# Patient Record
Sex: Female | Born: 1950 | Race: White | Hispanic: No | Marital: Married | Smoking: Never smoker
Health system: Southern US, Community
[De-identification: ages and names within clinical notes are randomized; demographics above are authoritative.]

## PROBLEM LIST (undated history)

## (undated) DIAGNOSIS — L570 Actinic keratosis: Secondary | ICD-10-CM

## (undated) DIAGNOSIS — I10 Essential (primary) hypertension: Secondary | ICD-10-CM

## (undated) DIAGNOSIS — G5603 Carpal tunnel syndrome, bilateral upper limbs: Secondary | ICD-10-CM

## (undated) DIAGNOSIS — Z973 Presence of spectacles and contact lenses: Secondary | ICD-10-CM

## (undated) HISTORY — DX: Actinic keratosis: L57.0

## (undated) HISTORY — PX: COLONOSCOPY: SHX174

## (undated) HISTORY — PX: VARICOSE VEIN SURGERY: SHX832

---

## 2005-12-10 ENCOUNTER — Ambulatory Visit: Payer: Self-pay | Admitting: Internal Medicine

## 2007-02-27 ENCOUNTER — Ambulatory Visit: Payer: Self-pay | Admitting: Nurse Practitioner

## 2008-04-29 ENCOUNTER — Ambulatory Visit: Payer: Self-pay | Admitting: Internal Medicine

## 2009-05-26 ENCOUNTER — Ambulatory Visit: Payer: Self-pay | Admitting: Family Medicine

## 2011-06-18 ENCOUNTER — Ambulatory Visit: Payer: Self-pay | Admitting: Internal Medicine

## 2015-12-06 ENCOUNTER — Other Ambulatory Visit: Payer: Self-pay | Admitting: Internal Medicine

## 2015-12-06 DIAGNOSIS — E785 Hyperlipidemia, unspecified: Secondary | ICD-10-CM | POA: Insufficient documentation

## 2015-12-06 DIAGNOSIS — J302 Other seasonal allergic rhinitis: Secondary | ICD-10-CM | POA: Insufficient documentation

## 2015-12-06 DIAGNOSIS — H903 Sensorineural hearing loss, bilateral: Secondary | ICD-10-CM | POA: Insufficient documentation

## 2015-12-06 DIAGNOSIS — Z1231 Encounter for screening mammogram for malignant neoplasm of breast: Secondary | ICD-10-CM

## 2015-12-08 ENCOUNTER — Ambulatory Visit
Admission: RE | Admit: 2015-12-08 | Discharge: 2015-12-08 | Disposition: A | Discharge status: Home / Self Care | Payer: Medicare HMO | Source: Ambulatory Visit | Attending: Internal Medicine | Admitting: Internal Medicine

## 2015-12-08 DIAGNOSIS — Z1231 Encounter for screening mammogram for malignant neoplasm of breast: Secondary | ICD-10-CM | POA: Diagnosis present

## 2015-12-21 DIAGNOSIS — G5603 Carpal tunnel syndrome, bilateral upper limbs: Secondary | ICD-10-CM | POA: Insufficient documentation

## 2017-03-21 ENCOUNTER — Other Ambulatory Visit: Payer: Self-pay | Admitting: Internal Medicine

## 2017-03-21 DIAGNOSIS — Z1231 Encounter for screening mammogram for malignant neoplasm of breast: Secondary | ICD-10-CM

## 2017-04-04 ENCOUNTER — Ambulatory Visit
Admission: RE | Admit: 2017-04-04 | Discharge: 2017-04-04 | Disposition: A | Discharge status: Home / Self Care | Payer: Medicare Other | Source: Ambulatory Visit | Attending: Internal Medicine | Admitting: Internal Medicine

## 2017-04-04 ENCOUNTER — Encounter: Payer: Self-pay | Admitting: Radiology

## 2017-04-04 DIAGNOSIS — Z1231 Encounter for screening mammogram for malignant neoplasm of breast: Secondary | ICD-10-CM

## 2017-06-19 DIAGNOSIS — I10 Essential (primary) hypertension: Secondary | ICD-10-CM | POA: Insufficient documentation

## 2017-10-29 ENCOUNTER — Encounter: Admission: RE | Payer: Self-pay | Source: Ambulatory Visit

## 2017-10-29 ENCOUNTER — Ambulatory Visit: Admission: RE | Admit: 2017-10-29 | Payer: Medicare Other | Source: Ambulatory Visit | Admitting: Gastroenterology

## 2017-10-29 SURGERY — COLONOSCOPY WITH PROPOFOL
Anesthesia: General

## 2018-04-08 ENCOUNTER — Other Ambulatory Visit: Payer: Self-pay | Admitting: Internal Medicine

## 2018-04-08 DIAGNOSIS — Z1231 Encounter for screening mammogram for malignant neoplasm of breast: Secondary | ICD-10-CM

## 2018-04-09 ENCOUNTER — Encounter (INDEPENDENT_AMBULATORY_CARE_PROVIDER_SITE_OTHER): Payer: Self-pay

## 2018-04-09 ENCOUNTER — Ambulatory Visit
Admission: RE | Admit: 2018-04-09 | Discharge: 2018-04-09 | Disposition: A | Discharge status: Home / Self Care | Payer: Medicare Other | Source: Ambulatory Visit | Attending: Internal Medicine | Admitting: Internal Medicine

## 2018-04-09 DIAGNOSIS — Z1231 Encounter for screening mammogram for malignant neoplasm of breast: Secondary | ICD-10-CM | POA: Diagnosis present

## 2018-05-05 ENCOUNTER — Telehealth: Payer: Self-pay | Admitting: Gastroenterology

## 2018-05-05 NOTE — Telephone Encounter (Signed)
Patient called to schedule colonoscopy. Please call

## 2018-05-07 NOTE — Telephone Encounter (Signed)
LVM for pt to call office to schedule.

## 2018-05-07 NOTE — Telephone Encounter (Signed)
LVM for

## 2018-05-15 ENCOUNTER — Other Ambulatory Visit: Payer: Self-pay

## 2018-05-15 ENCOUNTER — Telehealth: Payer: Self-pay

## 2018-05-15 DIAGNOSIS — Z1211 Encounter for screening for malignant neoplasm of colon: Secondary | ICD-10-CM

## 2018-05-15 NOTE — Telephone Encounter (Signed)
LVM for pt to callback to schedule her colonoscopy.

## 2018-05-27 ENCOUNTER — Other Ambulatory Visit: Payer: Self-pay

## 2018-05-27 ENCOUNTER — Encounter: Payer: Self-pay | Admitting: *Deleted

## 2018-05-29 NOTE — Discharge Instructions (Signed)
General Anesthesia, Adult, Care After °These instructions provide you with information about caring for yourself after your procedure. Your health care provider may also give you more specific instructions. Your treatment has been planned according to current medical practices, but problems sometimes occur. Call your health care provider if you have any problems or questions after your procedure. °What can I expect after the procedure? °After the procedure, it is common to have: °· Vomiting. °· A sore throat. °· Mental slowness. ° °It is common to feel: °· Nauseous. °· Cold or shivery. °· Sleepy. °· Tired. °· Sore or achy, even in parts of your body where you did not have surgery. ° °Follow these instructions at home: °For at least 24 hours after the procedure: °· Do not: °? Participate in activities where you could fall or become injured. °? Drive. °? Use heavy machinery. °? Drink alcohol. °? Take sleeping pills or medicines that cause drowsiness. °? Make important decisions or sign legal documents. °? Take care of children on your own. °· Rest. °Eating and drinking °· If you vomit, drink water, juice, or soup when you can drink without vomiting. °· Drink enough fluid to keep your urine clear or pale yellow. °· Make sure you have little or no nausea before eating solid foods. °· Follow the diet recommended by your health care provider. °General instructions °· Have a responsible adult stay with you until you are awake and alert. °· Return to your normal activities as told by your health care provider. Ask your health care provider what activities are safe for you. °· Take over-the-counter and prescription medicines only as told by your health care provider. °· If you smoke, do not smoke without supervision. °· Keep all follow-up visits as told by your health care provider. This is important. °Contact a health care provider if: °· You continue to have nausea or vomiting at home, and medicines are not helpful. °· You  cannot drink fluids or start eating again. °· You cannot urinate after 8-12 hours. °· You develop a skin rash. °· You have fever. °· You have increasing redness at the site of your procedure. °Get help right away if: °· You have difficulty breathing. °· You have chest pain. °· You have unexpected bleeding. °· You feel that you are having a life-threatening or urgent problem. °This information is not intended to replace advice given to you by your health care provider. Make sure you discuss any questions you have with your health care provider. °Document Released: 02/11/2001 Document Revised: 04/09/2016 Document Reviewed: 10/20/2015 °Elsevier Interactive Patient Education © 2018 [employer] ° °

## 2018-06-02 ENCOUNTER — Ambulatory Visit: Payer: Medicare Other | Admitting: Anesthesiology

## 2018-06-02 ENCOUNTER — Encounter
Admission: RE | Disposition: A | Discharge status: Home / Self Care | Payer: Self-pay | Source: Ambulatory Visit | Attending: Gastroenterology

## 2018-06-02 ENCOUNTER — Ambulatory Visit
Admission: RE | Admit: 2018-06-02 | Discharge: 2018-06-02 | Disposition: A | Discharge status: Home / Self Care | Payer: Medicare Other | Source: Ambulatory Visit | Attending: Gastroenterology | Admitting: Gastroenterology

## 2018-06-02 DIAGNOSIS — K640 First degree hemorrhoids: Secondary | ICD-10-CM | POA: Insufficient documentation

## 2018-06-02 DIAGNOSIS — Z1211 Encounter for screening for malignant neoplasm of colon: Secondary | ICD-10-CM

## 2018-06-02 DIAGNOSIS — Z79899 Other long term (current) drug therapy: Secondary | ICD-10-CM | POA: Insufficient documentation

## 2018-06-02 DIAGNOSIS — Z885 Allergy status to narcotic agent status: Secondary | ICD-10-CM | POA: Insufficient documentation

## 2018-06-02 DIAGNOSIS — I10 Essential (primary) hypertension: Secondary | ICD-10-CM | POA: Insufficient documentation

## 2018-06-02 DIAGNOSIS — Z8371 Family history of colonic polyps: Secondary | ICD-10-CM | POA: Insufficient documentation

## 2018-06-02 HISTORY — DX: Presence of spectacles and contact lenses: Z97.3

## 2018-06-02 HISTORY — DX: Essential (primary) hypertension: I10

## 2018-06-02 HISTORY — DX: Carpal tunnel syndrome, bilateral upper limbs: G56.03

## 2018-06-02 HISTORY — PX: COLONOSCOPY WITH PROPOFOL: SHX5780

## 2018-06-02 SURGERY — COLONOSCOPY WITH PROPOFOL
Anesthesia: General | Wound class: Contaminated

## 2018-06-02 MED ORDER — LACTATED RINGERS IV SOLN
INTRAVENOUS | Status: DC
  Administered 2018-06-02: 08:00:00 via INTRAVENOUS

## 2018-06-02 MED ORDER — ACETAMINOPHEN 160 MG/5ML PO SOLN: 325.0000 mg | Freq: Once | ORAL | Status: DC

## 2018-06-02 MED ORDER — PROPOFOL 10 MG/ML IV BOLUS
INTRAVENOUS | Status: DC | PRN
  Administered 2018-06-02: 20 mg via INTRAVENOUS
  Administered 2018-06-02: 30 mg via INTRAVENOUS
  Administered 2018-06-02: 70 mg via INTRAVENOUS
  Administered 2018-06-02: 20 mg via INTRAVENOUS
  Administered 2018-06-02: 30 mg via INTRAVENOUS
  Administered 2018-06-02 (×4): 20 mg via INTRAVENOUS
  Administered 2018-06-02: 30 mg via INTRAVENOUS

## 2018-06-02 MED ORDER — SODIUM CHLORIDE 0.9 % IV SOLN: INTRAVENOUS | Status: DC

## 2018-06-02 MED ORDER — ACETAMINOPHEN 325 MG PO TABS: 325.0000 mg | ORAL_TABLET | Freq: Once | ORAL | Status: DC

## 2018-06-02 MED ORDER — STERILE WATER FOR IRRIGATION IR SOLN
Status: DC | PRN
  Administered 2018-06-02: 5 mL

## 2018-06-02 MED ORDER — LIDOCAINE HCL (CARDIAC) PF 100 MG/5ML IV SOSY
PREFILLED_SYRINGE | INTRAVENOUS | Status: DC | PRN
  Administered 2018-06-02: 40 mg via INTRAVENOUS

## 2018-06-02 SURGICAL SUPPLY — 24 items
CANISTER SUCT 1200ML W/VALVE (MISCELLANEOUS) ×2 IMPLANT
CLIP HMST 235XBRD CATH ROT (MISCELLANEOUS) IMPLANT
CLIP RESOLUTION 360 11X235 (MISCELLANEOUS)
ELECT REM PT RETURN 9FT ADLT (ELECTROSURGICAL)
ELECTRODE REM PT RTRN 9FT ADLT (ELECTROSURGICAL) IMPLANT
FCP ESCP3.2XJMB 240X2.8X (MISCELLANEOUS)
FORCEPS BIOP RAD 4 LRG CAP 4 (CUTTING FORCEPS) IMPLANT
FORCEPS BIOP RJ4 240 W/NDL (MISCELLANEOUS)
FORCEPS ESCP3.2XJMB 240X2.8X (MISCELLANEOUS) IMPLANT
GOWN CVR UNV OPN BCK APRN NK (MISCELLANEOUS) ×2 IMPLANT
GOWN ISOL THUMB LOOP REG UNIV (MISCELLANEOUS) ×2
INJECTOR VARIJECT VIN23 (MISCELLANEOUS) IMPLANT
KIT DEFENDO VALVE AND CONN (KITS) IMPLANT
KIT ENDO PROCEDURE OLY (KITS) ×2 IMPLANT
MARKER SPOT ENDO TATTOO 5ML (MISCELLANEOUS) IMPLANT
PROBE APC STR FIRE (PROBE) IMPLANT
RETRIEVER NET ROTH 2.5X230 LF (MISCELLANEOUS) IMPLANT
SNARE SHORT THROW 13M SML OVAL (MISCELLANEOUS) IMPLANT
SNARE SHORT THROW 30M LRG OVAL (MISCELLANEOUS) IMPLANT
SNARE SNG USE RND 15MM (INSTRUMENTS) IMPLANT
SPOT EX ENDOSCOPIC TATTOO (MISCELLANEOUS)
TRAP ETRAP POLY (MISCELLANEOUS) IMPLANT
VARIJECT INJECTOR VIN23 (MISCELLANEOUS)
WATER STERILE IRR 250ML POUR (IV SOLUTION) ×2 IMPLANT

## 2018-06-02 NOTE — Anesthesia Procedure Notes (Signed)
Performed by: Orvile Corona, CRNA Pre-anesthesia Checklist: Patient identified, Emergency Drugs available, Suction available, Timeout performed and Patient being monitored Patient Re-evaluated:Patient Re-evaluated prior to induction Oxygen Delivery Method: Nasal cannula Placement Confirmation: positive ETCO2       

## 2018-06-02 NOTE — H&P (Signed)
Becky Lame, MD [facility] [address] [place], [place] [address] Phone:[phone] Fax : [phone]  Primary Care Physician:  Ezequiel Kayser, MD Primary Gastroenterologist:  Dr. Allen Norris  Pre-Procedure History & Physical: HPI:  Becky Carr is a 67 y.o. female is here for an colonoscopy.   Past Medical History:  Diagnosis Date  . Carpal tunnel syndrome on both sides   . Hypertension   . Wears contact lenses     Past Surgical History:  Procedure Laterality Date  . COLONOSCOPY    . VARICOSE VEIN SURGERY  1970s    Prior to Admission medications   Medication Sig Start Date End Date Taking? Authorizing Provider  amLODipine (NORVASC) 2.5 MG tablet Take 2.5 mg by mouth daily.   Yes [provider]  Ascorbic Acid (VITAMIN C PO) Take by mouth.   Yes [provider]  CALCIUM PO Take by mouth.   Yes [provider]  LYSINE PO Take by mouth.   Yes [provider]  Multiple Vitamin (MULTIVITAMIN) tablet Take 1 tablet by mouth daily.   Yes [provider]  Omega-3 Fatty Acids (FISH OIL PO) Take by mouth.   Yes [provider]    Allergies as of 05/15/2018 - never reviewed  Allergen Reaction Noted  . Hydrocodone Anxiety and Nausea Only 12/06/2015    Family History  Problem Relation Age of Onset  . Breast cancer Neg Hx     Social History   Socioeconomic History  . Marital status: Married    Spouse name: Not on file  . Number of children: Not on file  . Years of education: Not on file  . Highest education level: Not on file  Occupational History  . Not on file  Social Needs  . Financial resource strain: Not on file  . Food insecurity:    Worry: Not on file    Inability: Not on file  . Transportation needs:    Medical: Not on file    Non-medical: Not on file  Tobacco Use  . Smoking status: Never Smoker  . Smokeless tobacco: Never Used  Substance and Sexual Activity  . Alcohol use: Yes    Comment: rare  -Holidays  . Drug use: Not on file  . Sexual activity: Not on file  Lifestyle  . Physical activity:    Days per week: Not on file    Minutes per session: Not on file  . Stress: Not on file  Relationships  . Social connections:    Talks on phone: Not on file    Gets together: Not on file    Attends religious service: Not on file    Active member of club or organization: Not on file    Attends meetings of clubs or organizations: Not on file    Relationship status: Not on file  . Intimate partner violence:    Fear of current or ex partner: Not on file    Emotionally abused: Not on file    Physically abused: Not on file    Forced sexual activity: Not on file  Other Topics Concern  . Not on file  Social History Narrative  . Not on file    Review of Systems: See HPI, otherwise negative ROS  Physical Exam: BP 130/71   Pulse 89   Temp 97.7 F (36.5 C) (Temporal)   Resp 16   Ht 5\' 2"  (1.575 m)   Wt 157 lb (71.2 kg)   SpO2 98%  BMI 28.72 kg/m  General:   Alert,  pleasant and cooperative in NAD Head:  Normocephalic and atraumatic. Neck:  Supple; no masses or thyromegaly. Lungs:  Clear throughout to auscultation.    Heart:  Regular rate and rhythm. Abdomen:  Soft, nontender and nondistended. Normal bowel sounds, without guarding, and without rebound.   Neurologic:  Alert and  oriented x4;  grossly normal neurologically.  Impression/Plan: Becky Carr is here for an colonoscopy to be performed for family history of colon polyps  Risks, benefits, limitations, and alternatives regarding  colonoscopy have been reviewed with the patient.  Questions have been answered.  All parties agreeable.   Becky Lame, MD  06/02/2018, 7:56 AM

## 2018-06-02 NOTE — Transfer of Care (Signed)
Immediate Anesthesia Transfer of Care Note  Patient: Becky Carr  Procedure(s) Performed: COLONOSCOPY WITH PROPOFOL (N/A )  Patient Location: PACU  Anesthesia Type: General  Level of Consciousness: awake, alert  and patient cooperative  Airway and Oxygen Therapy: Patient Spontanous Breathing and Patient connected to supplemental oxygen  Post-op Assessment: Post-op Vital signs reviewed, Patient's Cardiovascular Status Stable, Respiratory Function Stable, Patent Airway and No signs of Nausea or vomiting  Post-op Vital Signs: Reviewed and stable  Complications: No apparent anesthesia complications

## 2018-06-02 NOTE — Anesthesia Postprocedure Evaluation (Signed)
Anesthesia Post Note  Patient: Becky Carr  Procedure(s) Performed: COLONOSCOPY WITH PROPOFOL (N/A )  Patient location during evaluation: PACU Anesthesia Type: General Level of consciousness: awake and alert and oriented Pain management: satisfactory to patient Vital Signs Assessment: post-procedure vital signs reviewed and stable Respiratory status: spontaneous breathing, nonlabored ventilation and respiratory function stable Cardiovascular status: blood pressure returned to baseline and stable Postop Assessment: Adequate PO intake and No signs of nausea or vomiting Anesthetic complications: no    Raliegh Ip

## 2018-06-02 NOTE — Op Note (Signed)
[facility] Gastroenterology Patient Name: Becky Carr Procedure Date: 06/02/2018 8:41 AM MRN: [id number] Account #: [id number] Date of Birth: [birth year] Admit Type: Outpatient Age: 67 Room: [facility] OR ROOM 01 Gender: Female Note Status: Finalized Procedure:            Colonoscopy Indications:          Family history of colonic polyps in a first-degree                        relative Providers:            Lucilla Lame MD, MD Referring MD:         Christena Flake. Raechel Ache, MD (Referring MD) Medicines:            Propofol per Anesthesia Complications:        No immediate complications. Procedure:            Pre-Anesthesia Assessment:                       - Prior to the procedure, a History and Physical was                        performed, and patient medications and allergies were                        reviewed. The patient's tolerance of previous                        anesthesia was also reviewed. The risks and benefits of                        the procedure and the sedation options and risks were                        discussed with the patient. All questions were                        answered, and informed consent was obtained. Prior                        Anticoagulants: The patient has taken no previous                        anticoagulant or antiplatelet agents. ASA Grade                        Assessment: II - A patient with mild systemic disease.                        After reviewing the risks and benefits, the patient was                        deemed in satisfactory condition to undergo the                        procedure.                       After obtaining informed consent, the colonoscope was  passed under direct vision. Throughout the procedure,                        the patient's blood pressure, pulse, and oxygen                        saturations were monitored continuously. The                        Colonoscope was introduced  through the anus and                        advanced to the the cecum, identified by appendiceal                        orifice and ileocecal valve. The colonoscopy was                        performed without difficulty. The patient tolerated the                        procedure well. The quality of the bowel preparation                        was excellent. Findings:      The perianal and digital rectal examinations were normal.      Non-bleeding internal hemorrhoids were found during retroflexion. The       hemorrhoids were Grade I (internal hemorrhoids that do not prolapse). Impression:           - Non-bleeding internal hemorrhoids.                       - No specimens collected. Recommendation:       - Discharge patient to home.                       - Resume previous diet.                       - Continue present medications.                       - Repeat colonoscopy in 5 years for surveillance. Procedure Code(s):    --- Professional ---                       [phone], Colonoscopy, flexible; diagnostic, including                        collection of specimen(s) by brushing or washing, when                        performed (separate procedure) Diagnosis Code(s):    --- Professional ---                       Z83.71, Family history of colonic polyps CPT copyright 2017 American Medical Association. All rights reserved. The codes documented in this report are preliminary and upon coder review may  be revised to meet current compliance requirements. Lucilla Lame MD, MD 06/02/2018 9:03:12 AM This report has been signed electronically. Number of Addenda: 0 Note Initiated On: 06/02/2018  8:41 AM Scope Withdrawal Time: 0 hours 6 minutes 36 seconds  Total Procedure Duration: 0 hours 10 minutes 58 seconds       [facility]

## 2018-06-02 NOTE — Anesthesia Preprocedure Evaluation (Signed)
Anesthesia Evaluation  Patient identified by MRN, date of birth, ID band Patient awake    Reviewed: Allergy & Precautions, H&P , NPO status , Patient's Chart, lab work & pertinent test results  Airway Mallampati: II  TM Distance: >3 FB Neck ROM: full    Dental no notable dental hx.    Pulmonary    Pulmonary exam normal breath sounds clear to auscultation       Cardiovascular hypertension, Normal cardiovascular exam Rhythm:regular Rate:Normal     Neuro/Psych    GI/Hepatic   Endo/Other    Renal/GU      Musculoskeletal   Abdominal   Peds  Hematology   Anesthesia Other Findings   Reproductive/Obstetrics                             Anesthesia Physical Anesthesia Plan  ASA: II  Anesthesia Plan: General   Post-op Pain Management:    Induction:   PONV Risk Score and Plan: 3 and Treatment may vary due to age or medical condition and Propofol infusion  Airway Management Planned: Natural Airway  Additional Equipment:   Intra-op Plan:   Post-operative Plan:   Informed Consent: I have reviewed the patients History and Physical, chart, labs and discussed the procedure including the risks, benefits and alternatives for the proposed anesthesia with the patient or authorized representative who has indicated his/her understanding and acceptance.     Plan Discussed with: CRNA  Anesthesia Plan Comments:         Anesthesia Quick Evaluation

## 2018-06-03 ENCOUNTER — Encounter: Payer: Self-pay | Admitting: Gastroenterology

## 2019-06-17 ENCOUNTER — Other Ambulatory Visit: Payer: Self-pay | Admitting: Internal Medicine

## 2019-06-17 DIAGNOSIS — Z1231 Encounter for screening mammogram for malignant neoplasm of breast: Secondary | ICD-10-CM

## 2019-07-23 ENCOUNTER — Encounter (INDEPENDENT_AMBULATORY_CARE_PROVIDER_SITE_OTHER): Payer: Self-pay

## 2019-07-23 ENCOUNTER — Other Ambulatory Visit: Payer: Self-pay

## 2019-07-23 ENCOUNTER — Ambulatory Visit
Admission: RE | Admit: 2019-07-23 | Discharge: 2019-07-23 | Disposition: A | Discharge status: Home / Self Care | Payer: Medicare Other | Source: Ambulatory Visit | Attending: Internal Medicine | Admitting: Internal Medicine

## 2019-07-23 DIAGNOSIS — Z1231 Encounter for screening mammogram for malignant neoplasm of breast: Secondary | ICD-10-CM | POA: Insufficient documentation

## 2020-02-12 ENCOUNTER — Other Ambulatory Visit: Payer: Self-pay

## 2020-02-12 ENCOUNTER — Ambulatory Visit: Payer: Medicare PPO | Admitting: Dermatology

## 2020-02-12 DIAGNOSIS — L820 Inflamed seborrheic keratosis: Secondary | ICD-10-CM

## 2020-02-12 DIAGNOSIS — Z1283 Encounter for screening for malignant neoplasm of skin: Secondary | ICD-10-CM

## 2020-02-12 DIAGNOSIS — D1801 Hemangioma of skin and subcutaneous tissue: Secondary | ICD-10-CM | POA: Diagnosis not present

## 2020-02-12 DIAGNOSIS — D225 Melanocytic nevi of trunk: Secondary | ICD-10-CM

## 2020-02-12 DIAGNOSIS — L570 Actinic keratosis: Secondary | ICD-10-CM

## 2020-02-12 DIAGNOSIS — L578 Other skin changes due to chronic exposure to nonionizing radiation: Secondary | ICD-10-CM

## 2020-02-12 DIAGNOSIS — L821 Other seborrheic keratosis: Secondary | ICD-10-CM

## 2020-02-12 DIAGNOSIS — I8393 Asymptomatic varicose veins of bilateral lower extremities: Secondary | ICD-10-CM

## 2020-02-12 DIAGNOSIS — L814 Other melanin hyperpigmentation: Secondary | ICD-10-CM | POA: Diagnosis not present

## 2020-02-12 DIAGNOSIS — D229 Melanocytic nevi, unspecified: Secondary | ICD-10-CM

## 2020-02-12 NOTE — Progress Notes (Signed)
   Follow-Up Visit   Subjective  Becky Carr is a 69 y.o. female who presents for the following: Other (Spot on scalp, left lower lip and left shoulder.) and Annual Exam (No history of skin cancer or abnormal moles.).    The following portions of the chart were reviewed this encounter and updated as appropriate:     Review of Systems: No other skin or systemic complaints.  Objective  Well appearing patient in no apparent distress; mood and affect are within normal limits.  A full examination was performed including scalp, head, eyes, ears, nose, lips, neck, chest, axillae, abdomen, back, buttocks, bilateral upper extremities, bilateral lower extremities, hands, feet, fingers, toes, fingernails, and toenails. All findings within normal limits unless otherwise noted below.  Objective  Left Lower Lip, Left dorsum nose, Left glabella: Erythematous thin papules/macules with gritty scale.   Objective  Chest - Medial [facility]), Face: Actinic changes.  Objective  Legs: Spider veins - asymptomatic.  Objective  Right Abdomen (side) - Upper: Hemangiomas - Red papules - Discussed benign nature - Observe - Call for any changes   Objective  Crown (2), Left Clavicle (3): Erythematous keratotic or waxy stuck-on papule or plaque. ISKs on scalp previously frozen in past several times, keeps coming back.  Itchy on clavicle  Objective  Left Forearm - Anterior, Neck - Posterior, Right Forearm - Anterior: Lentigines - Scattered tan macules - Discussed due to sun exposure - Benign, observe - Call for any changes   Objective  Chest - Medial [facility]): Flesh papules of chest.  Objective  Left Forearm - Anterior, Right Forearm - Anterior: Stuck-on, waxy, tan-brown papule or plaque --Discussed benign etiology and prognosis.   Assessment & Plan  AK (actinic keratosis) (3) Left glabella; Left dorsum nose; Left Lower Lip  Destruction of lesion - Left Lower Lip, Left dorsum nose, Left  glabella  Destruction method: cryotherapy   Informed consent: discussed and consent obtained   Lesion destroyed using liquid nitrogen: Yes   Region frozen until ice ball extended beyond lesion: Yes   Outcome: patient tolerated procedure well with no complications   Post-procedure details: wound care instructions given    Actinic skin damage (2) Face; Chest - Medial (Center)  Recommend broad spectrum SPF 30 + sunscreen and photoprotection   Asymptomatic varicose veins of both lower extremities Legs  Observe   Hemangioma of skin Right Abdomen (side) - Upper     Inflamed seborrheic keratosis (5) Crown (2); Left Clavicle (3)  Destruction of lesion - Crown, Left Clavicle  Destruction method: cryotherapy   Informed consent: discussed and consent obtained   Lesion destroyed using liquid nitrogen: Yes   Region frozen until ice ball extended beyond lesion: Yes   Outcome: patient tolerated procedure well with no complications   Post-procedure details: wound care instructions given    Lentigines (3) Neck - Posterior; Left Forearm - Anterior; Right Forearm - Anterior  Nevus Chest - Medial (Center)  Benign, observe.    Seborrheic keratosis (2) Left Forearm - Anterior; Right Forearm - Anterior  Observe   Return in about 6 months (around 08/14/2020) for AK follow up.   I, Ashok Cordia, CMA, am acting as scribe for Brendolyn Patty, MD .

## 2020-02-12 NOTE — Patient Instructions (Signed)

## 2020-06-21 ENCOUNTER — Other Ambulatory Visit: Payer: Self-pay | Admitting: Internal Medicine

## 2020-06-21 DIAGNOSIS — Z1231 Encounter for screening mammogram for malignant neoplasm of breast: Secondary | ICD-10-CM

## 2020-07-26 ENCOUNTER — Other Ambulatory Visit: Payer: Self-pay

## 2020-07-26 ENCOUNTER — Ambulatory Visit
Admission: RE | Admit: 2020-07-26 | Discharge: 2020-07-26 | Disposition: A | Discharge status: Home / Self Care | Payer: Medicare PPO | Source: Ambulatory Visit | Attending: Internal Medicine | Admitting: Internal Medicine

## 2020-07-26 DIAGNOSIS — Z1231 Encounter for screening mammogram for malignant neoplasm of breast: Secondary | ICD-10-CM | POA: Insufficient documentation

## 2020-08-09 ENCOUNTER — Ambulatory Visit: Payer: Medicare PPO | Admitting: Dermatology

## 2021-01-29 IMAGING — MG MM DIGITAL SCREENING BILAT W/ TOMO W/ CAD
8 series · 8 of 24 positions shown · non-contrast
Comparison: Previous exam(s).

CLINICAL DATA: Screening.

EXAM:
DIGITAL SCREENING BILATERAL MAMMOGRAM WITH TOMO AND CAD

[R CC synth-2D]
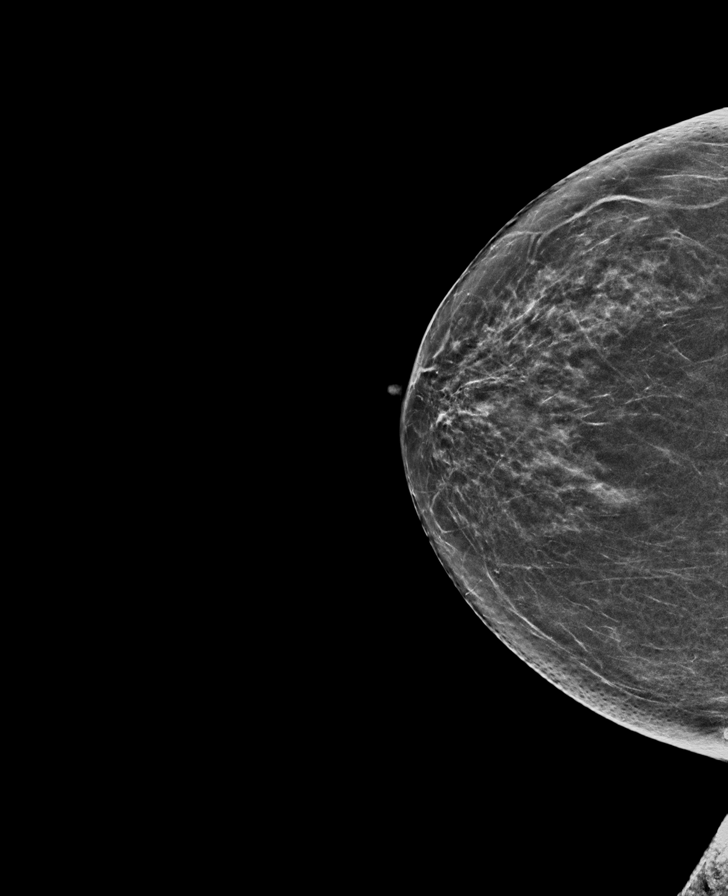

[L CC synth-2D]
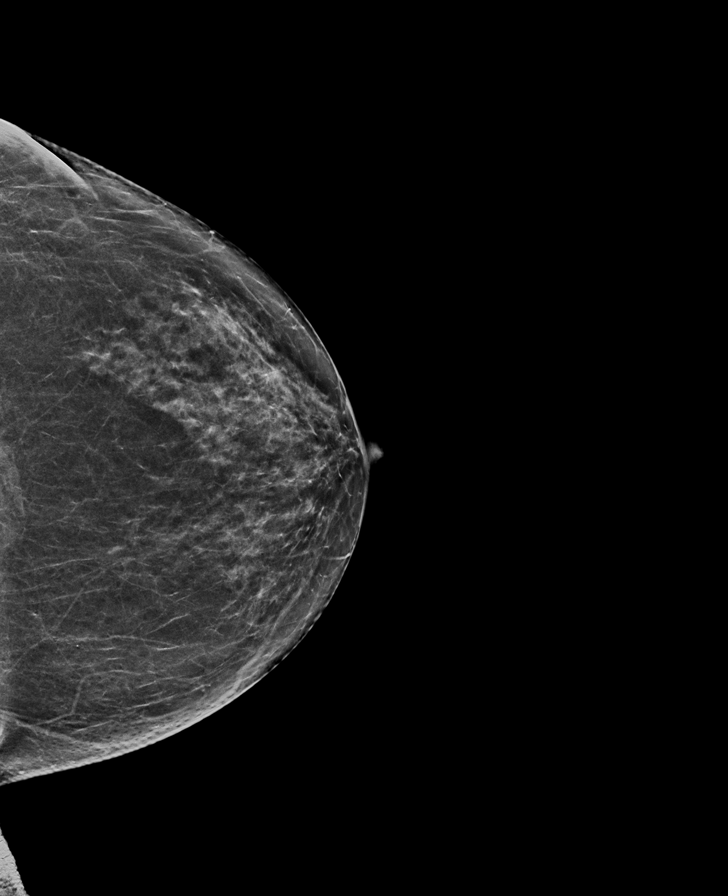

[L MLO synth-2D]
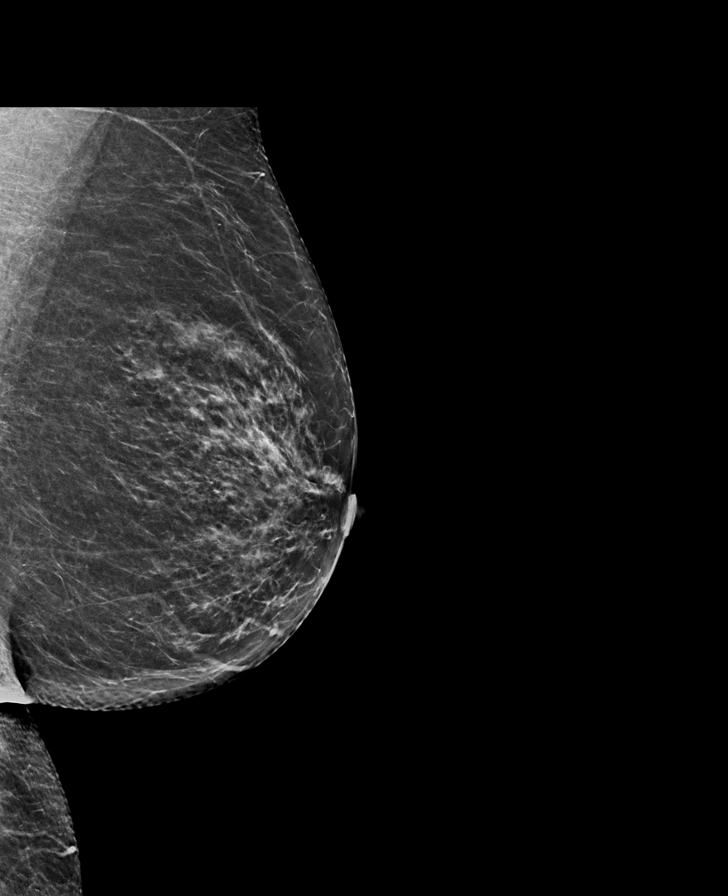

[R MLO synth-2D]
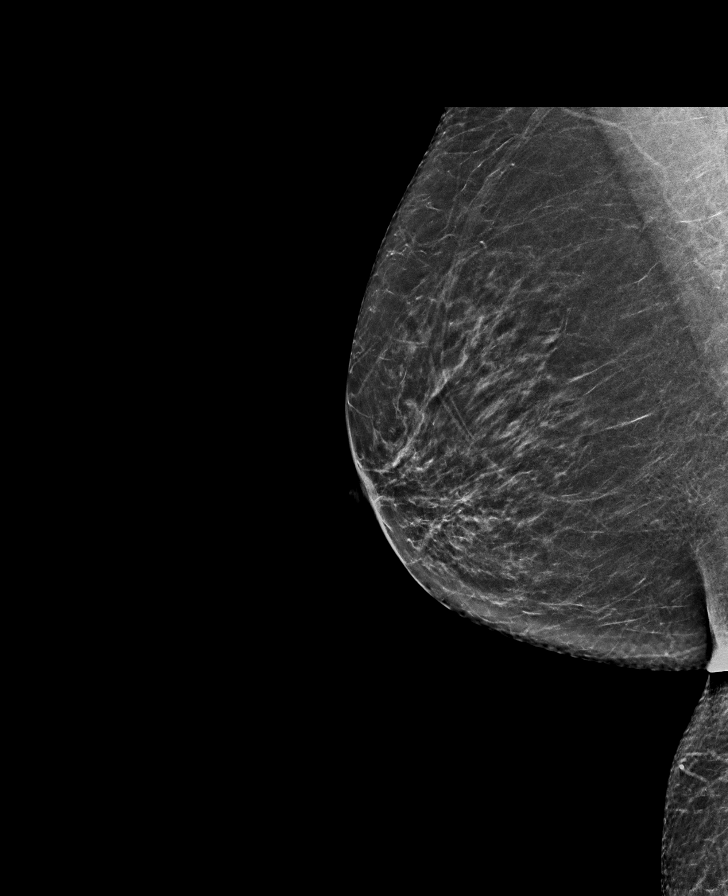

[R CC tomo · tomo slice 31/62.0]
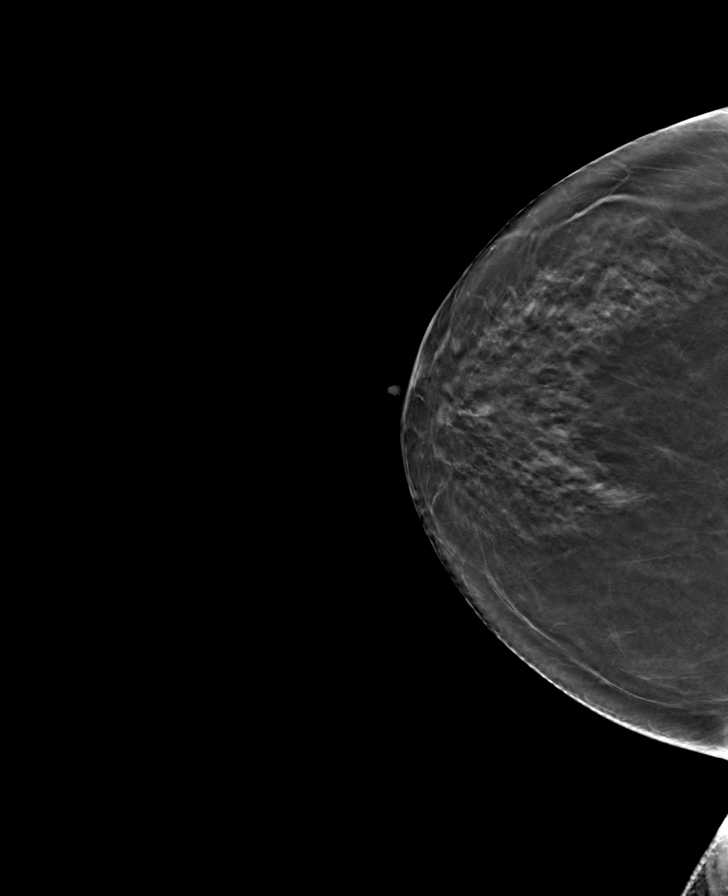

[L MLO tomo · tomo slice 33/65.0]
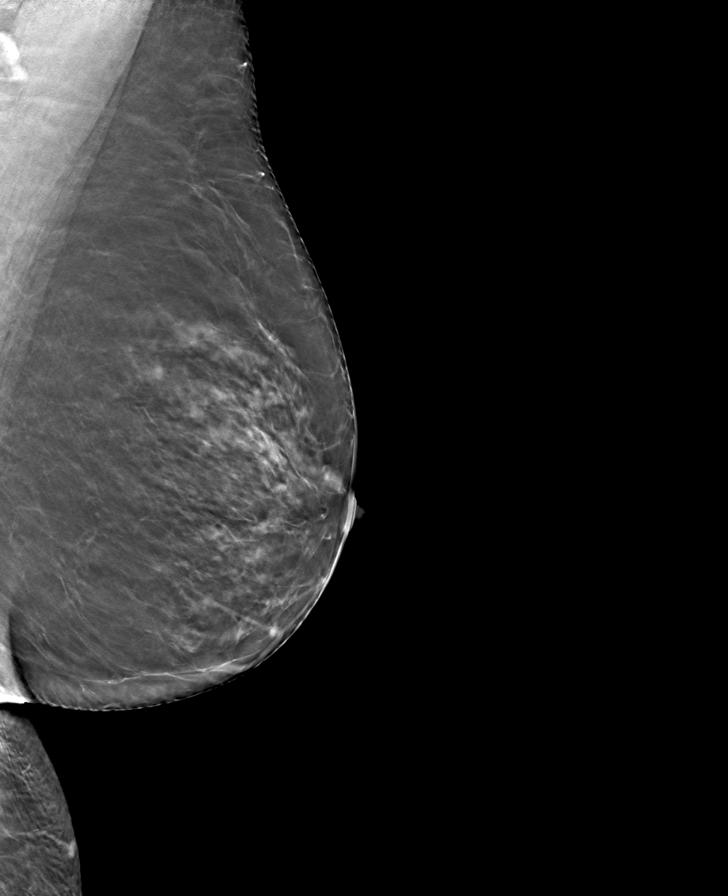

[L CC tomo · tomo slice 33/65.0]
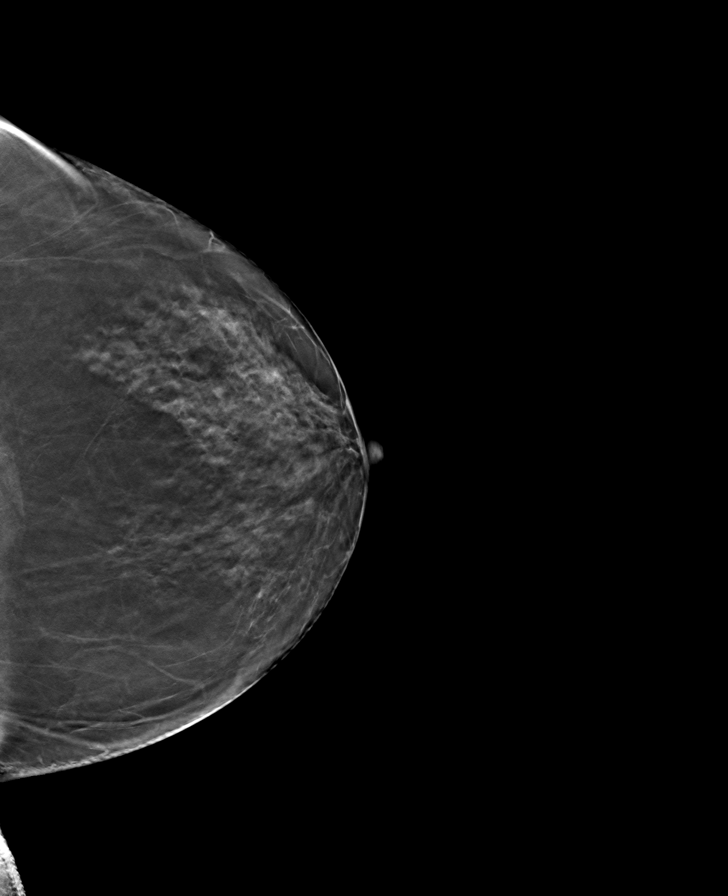

[R MLO tomo · tomo slice 33/66.0]
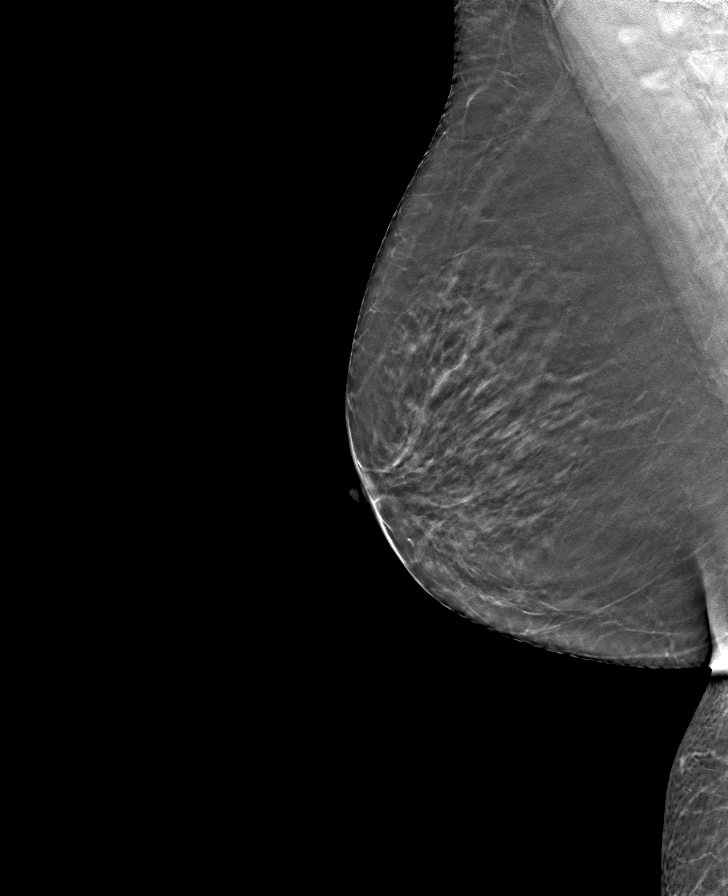

[8 of 24 positions shown; findings below may reference images not displayed]

ACR Breast Density Category b: There are scattered areas of
fibroglandular density.
FINDINGS: There are no findings suspicious for malignancy. Images were
processed with CAD.
IMPRESSION: No mammographic evidence of malignancy. A result letter of this
screening mammogram will be mailed directly to the patient.

RECOMMENDATION:
Screening mammogram in one year. (Code:CN-U-775)

BI-RADS CATEGORY  1: Negative.

## 2021-04-04 ENCOUNTER — Other Ambulatory Visit: Payer: Self-pay

## 2021-04-04 ENCOUNTER — Ambulatory Visit: Payer: Medicare PPO | Admitting: Dermatology

## 2021-04-04 DIAGNOSIS — L820 Inflamed seborrheic keratosis: Secondary | ICD-10-CM

## 2021-04-04 DIAGNOSIS — L719 Rosacea, unspecified: Secondary | ICD-10-CM

## 2021-04-04 DIAGNOSIS — L578 Other skin changes due to chronic exposure to nonionizing radiation: Secondary | ICD-10-CM | POA: Diagnosis not present

## 2021-04-04 DIAGNOSIS — L570 Actinic keratosis: Secondary | ICD-10-CM | POA: Diagnosis not present

## 2021-04-04 MED ORDER — METRONIDAZOLE 1 % EX GEL
CUTANEOUS | 3 refills | Status: AC
Start: 2021-04-04 — End: ?

## 2021-04-04 NOTE — Patient Instructions (Addendum)
Cryotherapy Aftercare  . Wash gently with soap and water everyday.   Marland Kitchen Apply Vaseline and Band-Aid daily until healed.   Rosacea is a chronic progressive skin condition usually affecting the face of adults, causing redness and/or acne bumps. It is treatable but not curable. It sometimes affects the eyes (ocular rosacea) as well. It may respond to topical and/or systemic medication and can flare with stress, sun exposure, alcohol, exercise and some foods.  Daily application of broad spectrum spf 30+ sunscreen to face is recommended to reduce flares.  If you have any questions or concerns for your doctor, please call our main line at [phone] and press option 4 to reach your doctor's medical assistant. If no one answers, please leave a voicemail as directed and we will return your call as soon as possible. Messages left after 4 pm will be answered the following business day.   You may also send [place] a message via [place]. We typically respond to MyChart messages within 1-2 business days.  For prescription refills, please ask your pharmacy to contact our office. Our fax number is [phone].  If you have an urgent issue when the clinic is closed that cannot wait until the next business day, you can page your doctor at the number below.    Please note that while we do our best to be available for urgent issues outside of office hours, we are not available 24/7.   If you have an urgent issue and are unable to reach [place], you may choose to seek medical care at your doctor's office, retail clinic, urgent care center, or emergency room.  If you have a medical emergency, please immediately call 911 or go to the emergency department.  Pager Numbers  - Dr. Nehemiah Massed: [phone]  - Dr. Laurence Ferrari: [phone]  - Dr. Nicole Kindred: [phone]  In the event of inclement weather, please call our main line at [phone] for an update on the status of any delays or closures.  Dermatology Medication  Tips: Please keep the boxes that topical medications come in in order to help keep track of the instructions about where and how to use these. Pharmacies typically print the medication instructions only on the boxes and not directly on the medication tubes.   If your medication is too expensive, please contact our office at [phone] option 4 or send [place] a message through [place].   We are unable to tell what your co-pay for medications will be in advance as this is different depending on your insurance coverage. However, we may be able to find a substitute medication at lower cost or fill out paperwork to get insurance to cover a needed medication.   If a prior authorization is required to get your medication covered by your insurance company, please allow [place] 1-2 business days to complete this process.  Drug prices often vary depending on where the prescription is filled and some pharmacies may offer cheaper prices.  The website www.goodrx.com contains coupons for medications through different pharmacies. The prices here do not account for what the cost may be with help from insurance (it may be cheaper with your insurance), but the website can give you the price if you did not use any insurance.  - You can print the associated coupon and take it with your prescription to the pharmacy.  - You may also stop by our office during regular business hours and pick up a GoodRx coupon card.  - If you need your prescription sent electronically to  a different pharmacy, notify our office through [facility] or by phone at [phone] option 4.

## 2021-04-04 NOTE — Progress Notes (Signed)
Follow-Up Visit   Subjective  Becky Carr is a 70 y.o. female who presents for the following: Itchy spots (Patient here for spots on the scalp that are itchy, present for several months.). She also has a history of AKs of the face. No history of skin cancer.  She has a new red spot on nose.  They come and go.   The following portions of the chart were reviewed this encounter and updated as appropriate:       Review of Systems:  No other skin or systemic complaints except as noted in HPI or Assessment and Plan.  Objective  Well appearing patient in no apparent distress; mood and affect are within normal limits.  A focused examination was performed including face, scalp. Relevant physical exam findings are noted in the Assessment and Plan.  Objective  vertex scalp x 1, mid crown x 1: Erythematous keratotic or waxy stuck-on papule or plaque.   Objective  R mid cheek x 1, L lower lip at vermillion x 1, L upper brow x 1, R upper forehead at hairline x 1 (4): Erythematous thin papules/macules with gritty scale.   Objective  face: Mild erythema of the cheeks and nose; inflammatory papule of the nose.   Assessment & Plan   Actinic Damage - chronic, secondary to cumulative UV radiation exposure/sun exposure over time - diffuse scaly erythematous macules with underlying dyspigmentation - Recommend daily broad spectrum sunscreen SPF 30+ to sun-exposed areas, reapply every 2 hours as needed.  - Recommend staying in the shade or wearing long sleeves, sun glasses (UVA+UVB protection) and wide brim hats (4-inch brim around the entire circumference of the hat). - Call for new or changing lesions.   Inflamed seborrheic keratosis vertex scalp x 1, mid crown x 1  Prior to procedure, discussed risks of blister formation, small wound, skin dyspigmentation, or rare scar following cryotherapy.    Destruction of lesion - vertex scalp x 1, mid crown x 1  Destruction method: cryotherapy    Informed consent: discussed and consent obtained   Lesion destroyed using liquid nitrogen: Yes   Region frozen until ice ball extended beyond lesion: Yes   Outcome: patient tolerated procedure well with no complications   Post-procedure details: wound care instructions given    AK (actinic keratosis) (4) R mid cheek x 1, L lower lip at vermillion x 1, L upper brow x 1, R upper forehead at hairline x 1  Prior to procedure, discussed risks of blister formation, small wound, skin dyspigmentation, or rare scar following cryotherapy.    Destruction of lesion - R mid cheek x 1, L lower lip at vermillion x 1, L upper brow x 1, R upper forehead at hairline x 1  Destruction method: cryotherapy   Informed consent: discussed and consent obtained   Lesion destroyed using liquid nitrogen: Yes   Region frozen until ice ball extended beyond lesion: Yes   Outcome: patient tolerated procedure well with no complications   Post-procedure details: wound care instructions given    Rosacea face  Rosacea is a chronic progressive skin condition usually affecting the face of adults, causing redness and/or acne bumps. It is treatable but not curable. It sometimes affects the eyes (ocular rosacea) as well. It may respond to topical and/or systemic medication and can flare with stress, sun exposure, alcohol, exercise and some foods.  Daily application of broad spectrum spf 30+ sunscreen to face is recommended to reduce flares.  Start Metrogel 1% Apply to  cheeks, nose QD dsp 60g 3Rf.   metroNIDAZOLE (METROGEL) 1 % gel - face  Return if symptoms worsen or fail to improve.  IJamesetta Orleans, CMA, am acting as scribe for Brendolyn Patty, MD .  Documentation: I have reviewed the above documentation for accuracy and completeness, and I agree with the above.  Brendolyn Patty MD

## 2021-04-12 ENCOUNTER — Ambulatory Visit: Payer: Medicare PPO | Admitting: Dermatology

## 2021-04-19 ENCOUNTER — Ambulatory Visit: Payer: Medicare PPO | Admitting: Dermatology

## 2021-07-18 ENCOUNTER — Other Ambulatory Visit: Payer: Self-pay | Admitting: Gerontology

## 2021-07-18 DIAGNOSIS — Z1231 Encounter for screening mammogram for malignant neoplasm of breast: Secondary | ICD-10-CM

## 2021-08-02 ENCOUNTER — Other Ambulatory Visit: Payer: Self-pay

## 2021-08-02 ENCOUNTER — Ambulatory Visit
Admission: RE | Admit: 2021-08-02 | Discharge: 2021-08-02 | Disposition: A | Discharge status: Home / Self Care | Payer: Medicare PPO | Source: Ambulatory Visit | Attending: Gerontology | Admitting: Gerontology

## 2021-08-02 DIAGNOSIS — Z1231 Encounter for screening mammogram for malignant neoplasm of breast: Secondary | ICD-10-CM | POA: Diagnosis present

## 2022-05-29 ENCOUNTER — Encounter: Payer: Self-pay | Admitting: Dermatology

## 2022-05-29 ENCOUNTER — Ambulatory Visit: Payer: Medicare PPO | Admitting: Dermatology

## 2022-05-29 DIAGNOSIS — L814 Other melanin hyperpigmentation: Secondary | ICD-10-CM

## 2022-05-29 DIAGNOSIS — W57XXXA Bitten or stung by nonvenomous insect and other nonvenomous arthropods, initial encounter: Secondary | ICD-10-CM

## 2022-05-29 DIAGNOSIS — L821 Other seborrheic keratosis: Secondary | ICD-10-CM | POA: Diagnosis not present

## 2022-05-29 DIAGNOSIS — S0086XA Insect bite (nonvenomous) of other part of head, initial encounter: Secondary | ICD-10-CM | POA: Diagnosis not present

## 2022-05-29 DIAGNOSIS — L578 Other skin changes due to chronic exposure to nonionizing radiation: Secondary | ICD-10-CM

## 2022-05-29 DIAGNOSIS — L570 Actinic keratosis: Secondary | ICD-10-CM

## 2022-05-29 NOTE — Progress Notes (Signed)
   Follow-Up Visit   Subjective  RULA KENISTON is a 71 y.o. female who presents for the following: Scaly spots (Scalp x 2 months. She has a history of ISKs of the scalp and history of Aks of the face. No history of skin cancer. ).   The following portions of the chart were reviewed this encounter and updated as appropriate:       Review of Systems:  No other skin or systemic complaints except as noted in HPI or Assessment and Plan.  Objective  Well appearing patient in no apparent distress; mood and affect are within normal limits.  A focused examination was performed including face, scalp. Relevant physical exam findings are noted in the Assessment and Plan.  frontal hairline x 3, crown x 4, L distal forearm x 1 (8) Keratotic macules. Pink scaly macules  left lateral cheek Crusted excoriation     Assessment & Plan  Actinic Damage - chronic, secondary to cumulative UV radiation exposure/sun exposure over time - diffuse scaly erythematous macules with underlying dyspigmentation - Recommend daily broad spectrum sunscreen SPF 30+ to sun-exposed areas, reapply every 2 hours as needed.  - Recommend staying in the shade or wearing long sleeves, sun glasses (UVA+UVB protection) and wide brim hats (4-inch brim around the entire circumference of the hat). - Call for new or changing lesions.  Lentigines - Scattered tan macules - Due to sun exposure - Benign-appering, observe - Recommend daily broad spectrum sunscreen SPF 30+ to sun-exposed areas, reapply every 2 hours as needed. - Call for any changes  Seborrheic Keratoses - Stuck-on, waxy, tan-brown papules and/or plaques  - Benign-appearing - Discussed benign etiology and prognosis. - Observe - Call for any changes  AK (actinic keratosis) (8) frontal hairline x 3, crown x 4, L distal forearm x 1  Actinic keratoses are precancerous spots that appear secondary to cumulative UV radiation exposure/sun exposure over time. They  are chronic with expected duration over 1 year. A portion of actinic keratoses will progress to squamous cell carcinoma of the skin. It is not possible to reliably predict which spots will progress to skin cancer and so treatment is recommended to prevent development of skin cancer.  Recommend daily broad spectrum sunscreen SPF 30+ to sun-exposed areas, reapply every 2 hours as needed.  Recommend staying in the shade or wearing long sleeves, sun glasses (UVA+UVB protection) and wide brim hats (4-inch brim around the entire circumference of the hat). Call for new or changing lesions.  Destruction of lesion - frontal hairline x 3, crown x 4, L distal forearm x 1  Destruction method: cryotherapy   Informed consent: discussed and consent obtained   Lesion destroyed using liquid nitrogen: Yes   Region frozen until ice ball extended beyond lesion: Yes   Outcome: patient tolerated procedure well with no complications   Post-procedure details: wound care instructions given   Additional details:  Prior to procedure, discussed risks of blister formation, small wound, skin dyspigmentation, or rare scar following cryotherapy. Recommend Vaseline ointment to treated areas while healing.   Bug bite without infection, initial encounter left lateral cheek  Recommend Vaseline or antibiotic ointment qd until healed. Avoid picking/scratching   Return if symptoms worsen or fail to improve.  IJamesetta Orleans, CMA, am acting as scribe for Brendolyn Patty, MD .  Documentation: I have reviewed the above documentation for accuracy and completeness, and I agree with the above.  Brendolyn Patty MD

## 2022-05-29 NOTE — Patient Instructions (Addendum)
Cryotherapy Aftercare  Wash gently with soap and water everyday.   Apply Vaseline and Band-Aid daily until healed.     Due to recent changes in healthcare laws, you may see results of your pathology and/or laboratory studies on MyChart before the doctors have had a chance to review them. We understand that in some cases there may be results that are confusing or concerning to you. Please understand that not all results are received at the same time and often the doctors may need to interpret multiple results in order to provide you with the best plan of care or course of treatment. Therefore, we ask that you please give us 2 business days to thoroughly review all your results before contacting the office for clarification. Should we see a critical lab result, you will be contacted sooner.   If You Need Anything After Your Visit  If you have any questions or concerns for your doctor, please call our main line at [phone] and press option 4 to reach your doctor's medical assistant. If no one answers, please leave a voicemail as directed and we will return your call as soon as possible. Messages left after 4 pm will be answered the following business day.   You may also send us a message via MyChart. We typically respond to MyChart messages within 1-2 business days.  For prescription refills, please ask your pharmacy to contact our office. Our fax number is [phone].  If you have an urgent issue when the clinic is closed that cannot wait until the next business day, you can page your doctor at the number below.    Please note that while we do our best to be available for urgent issues outside of office hours, we are not available 24/7.   If you have an urgent issue and are unable to reach us, you may choose to seek medical care at your doctor's office, retail clinic, urgent care center, or emergency room.  If you have a medical emergency, please immediately call 911 or go to the  emergency department.  Pager Numbers  - Dr. Kowalski: [phone]  - Dr. Moye: [phone]  - Dr. Stewart: [phone]  In the event of inclement weather, please call our main line at [phone] for an update on the status of any delays or closures.  Dermatology Medication Tips: Please keep the boxes that topical medications come in in order to help keep track of the instructions about where and how to use these. Pharmacies typically print the medication instructions only on the boxes and not directly on the medication tubes.   If your medication is too expensive, please contact our office at [phone] option 4 or send us a message through MyChart.   We are unable to tell what your co-pay for medications will be in advance as this is different depending on your insurance coverage. However, we may be able to find a substitute medication at lower cost or fill out paperwork to get insurance to cover a needed medication.   If a prior authorization is required to get your medication covered by your insurance company, please allow us 1-2 business days to complete this process.  Drug prices often vary depending on where the prescription is filled and some pharmacies may offer cheaper prices.  The website www.goodrx.com contains coupons for medications through different pharmacies. The prices here do not account for what the cost may be with help from insurance (it may be cheaper with your insurance), but the website can   give you the price if you did not use any insurance.  - You can print the associated coupon and take it with your prescription to the pharmacy.  - You may also stop by our office during regular business hours and pick up a GoodRx coupon card.  - If you need your prescription sent electronically to a different pharmacy, notify our office through [place] MyChart or by phone at [phone] option 4.     Si Usted Necesita Algo Despus de Su Visita  Tambin puede  enviarnos un mensaje a travs de MyChart. Por lo general respondemos a los mensajes de MyChart en el transcurso de 1 a 2 das hbiles.  Para renovar recetas, por favor pida a su farmacia que se ponga en contacto con nuestra oficina. Nuestro nmero de fax es el [phone].  Si tiene un asunto urgente cuando la clnica est cerrada y que no puede esperar hasta el siguiente da hbil, puede llamar/localizar a su doctor(a) al nmero que aparece a continuacin.   Por favor, tenga en cuenta que aunque hacemos todo lo posible para estar disponibles para asuntos urgentes fuera del horario de oficina, no estamos disponibles las 24 horas del da, los 7 das de la semana.   Si tiene un problema urgente y no puede comunicarse con nosotros, puede optar por buscar atencin mdica  en el consultorio de su doctor(a), en una clnica privada, en un centro de atencin urgente o en una sala de emergencias.  Si tiene una emergencia mdica, por favor llame inmediatamente al 911 o vaya a la sala de emergencias.  Nmeros de bper  - Dr. Kowalski: [phone]  - Dra. Moye: [phone]  - Dra. Stewart: [phone]  En caso de inclemencias del tiempo, por favor llame a nuestra lnea principal al [phone] para una actualizacin sobre el estado de cualquier retraso o cierre.  Consejos para la medicacin en dermatologa: Por favor, guarde las cajas en las que vienen los medicamentos de uso tpico para ayudarle a seguir las instrucciones sobre dnde y cmo usarlos. Las farmacias generalmente imprimen las instrucciones del medicamento slo en las cajas y no directamente en los tubos del medicamento.   Si su medicamento es muy caro, por favor, pngase en contacto con nuestra oficina llamando al [phone] y presione la opcin 4 o envenos un mensaje a travs de MyChart.   No podemos decirle cul ser su copago por los medicamentos por adelantado ya que esto es diferente dependiendo de la cobertura de su seguro.  Sin embargo, es posible que podamos encontrar un medicamento sustituto a menor costo o llenar un formulario para que el seguro cubra el medicamento que se considera necesario.   Si se requiere una autorizacin previa para que su compaa de seguros cubra su medicamento, por favor permtanos de 1 a 2 das hbiles para completar este proceso.  Los precios de los medicamentos varan con frecuencia dependiendo del lugar de dnde se surte la receta y alguna farmacias pueden ofrecer precios ms baratos.  El sitio web www.goodrx.com tiene cupones para medicamentos de diferentes farmacias. Los precios aqu no tienen en cuenta lo que podra costar con la ayuda del seguro (puede ser ms barato con su seguro), pero el sitio web puede darle el precio si no utiliz ningn seguro.  - Puede imprimir el cupn correspondiente y llevarlo con su receta a la farmacia.  - Tambin puede pasar por nuestra oficina durante el horario de atencin regular y recoger una tarjeta de cupones de GoodRx.  -   Si necesita que su receta se enve electrnicamente a una farmacia diferente, informe a nuestra oficina a travs de MyChart de [facility] o por telfono llamando al [phone] y presione la opcin 4.  

## 2022-06-25 ENCOUNTER — Other Ambulatory Visit: Payer: Self-pay | Admitting: Gerontology

## 2022-06-25 DIAGNOSIS — Z1231 Encounter for screening mammogram for malignant neoplasm of breast: Secondary | ICD-10-CM

## 2022-08-06 ENCOUNTER — Ambulatory Visit
Admission: RE | Admit: 2022-08-06 | Discharge: 2022-08-06 | Disposition: A | Discharge status: Home / Self Care | Payer: Medicare PPO | Source: Ambulatory Visit | Attending: Gerontology | Admitting: Gerontology

## 2022-08-06 DIAGNOSIS — Z1231 Encounter for screening mammogram for malignant neoplasm of breast: Secondary | ICD-10-CM | POA: Insufficient documentation

## 2022-11-21 ENCOUNTER — Ambulatory Visit: Payer: Medicare PPO | Admitting: Dermatology

## 2022-11-21 VITALS — BP 163/74

## 2022-11-21 DIAGNOSIS — L300 Nummular dermatitis: Secondary | ICD-10-CM

## 2022-11-21 MED ORDER — MOMETASONE FUROATE 0.1 % EX CREA: TOPICAL_CREAM | CUTANEOUS | 1 refills | Status: AC

## 2022-11-21 NOTE — Progress Notes (Signed)
   Follow-Up Visit   Subjective  Becky Carr is a 72 y.o. female who presents for the following: Rash (Ankles x 2-3 months. She has used Aloe Vera to treat. Was itchy at first, but not now, spreading. Areas came up and haven't gone away. Dry.).    The following portions of the chart were reviewed this encounter and updated as appropriate:       Review of Systems:  No other skin or systemic complaints except as noted in HPI or Assessment and Plan.  Objective  Well appearing patient in no apparent distress; mood and affect are within normal limits.  A focused examination was performed including legs. Relevant physical exam findings are noted in the Assessment and Plan.  bilateral ankles, lower legs pink scaly patches, R > L ankles, L foot dorsum; xerosis with mild erythema of the bilateral lower legs    Assessment & Plan  Nummular dermatitis bilateral ankles, lower legs  Recommend mild soap and moisturizing cream 1-2 times daily.  Gentle skin care handout provided.  [facility] Skin Therapy sample given.   Start mometasone cream Apply to AA rash BID until improved dsp 50g 1Rf.  Topical steroids (such as triamcinolone, fluocinolone, fluocinonide, mometasone, clobetasol, halobetasol, betamethasone, hydrocortisone) can cause thinning and lightening of the skin if they are used for too long in the same area. Your physician has selected the right strength medicine for your problem and area affected on the body. Please use your medication only as directed by your physician to prevent side effects.        mometasone (ELOCON) 0.1 % cream - bilateral ankles, lower legs Apply to affected areas rash twice daily until improved.   Return if symptoms worsen or fail to improve.  Documentation: I have reviewed the above documentation for accuracy and completeness, and I agree with the above.  Brendolyn Patty MD

## 2022-11-21 NOTE — Patient Instructions (Addendum)
Start mometasone cream - Apply to affected areas rash on lower legs twice a day until improved. Use up to 4 weeks. Topical steroids (such as triamcinolone, fluocinolone, fluocinonide, mometasone, clobetasol, halobetasol, betamethasone, hydrocortisone) can cause thinning and lightening of the skin if they are used for too long in the same area. Your physician has selected the right strength medicine for your problem and area affected on the body. Please use your medication only as directed by your physician to prevent side effects.    Gentle Skin Care Guide  1. Bathe no more than once a day.  2. Avoid bathing in hot water  3. Use a mild soap like Dove, Vanicream, Cetaphil, CeraVe. Can use Lever 2000 or Cetaphil antibacterial soap  4. Use soap only where you need it. On most days, use it under your arms, between your legs, and on your feet. Let the water rinse other areas unless visibly dirty.  5. When you get out of the bath/shower, use a towel to gently blot your skin dry, don't rub it.  6. While your skin is still a little damp, apply a moisturizing cream such as Vanicream, CeraVe, Cetaphil, Eucerin, Sarna lotion or plain Vaseline Jelly. For hands apply Neutrogena [place] Hand Cream or Excipial Hand Cream.  7. Reapply moisturizer any time you start to itch or feel dry.  8. Sometimes using free and clear laundry detergents can be helpful. Fabric softener sheets should be avoided. Downy Free & Gentle liquid, or any liquid fabric softener that is free of dyes and perfumes, it acceptable to use  9. If your doctor has given you prescription creams you may apply moisturizers over them      Due to recent changes in healthcare laws, you may see results of your pathology and/or laboratory studies on MyChart before the doctors have had a chance to review them. We understand that in some cases there may be results that are confusing or concerning to you. Please understand that not all results are  received at the same time and often the doctors may need to interpret multiple results in order to provide you with the best plan of care or course of treatment. Therefore, we ask that you please give [place] 2 business days to thoroughly review all your results before contacting the office for clarification. Should we see a critical lab result, you will be contacted sooner.   If You Need Anything After Your Visit  If you have any questions or concerns for your doctor, please call our main line at [phone] and press option 4 to reach your doctor's medical assistant. If no one answers, please leave a voicemail as directed and we will return your call as soon as possible. Messages left after 4 pm will be answered the following business day.   You may also send [place] a message via [place]. We typically respond to MyChart messages within 1-2 business days.  For prescription refills, please ask your pharmacy to contact our office. Our fax number is [phone].  If you have an urgent issue when the clinic is closed that cannot wait until the next business day, you can page your doctor at the number below.    Please note that while we do our best to be available for urgent issues outside of office hours, we are not available 24/7.   If you have an urgent issue and are unable to reach [place], you may choose to seek medical care at your doctor's office, retail clinic, urgent care  center, or emergency room.  If you have a medical emergency, please immediately call 911 or go to the emergency department.  Pager Numbers  - Dr. Nehemiah Massed: [phone]  - Dr. Laurence Ferrari: [phone]  - Dr. Nicole Kindred: [phone]  In the event of inclement weather, please call our main line at [phone] for an update on the status of any delays or closures.  Dermatology Medication Tips: Please keep the boxes that topical medications come in in order to help keep track of the instructions about where and how to use these.  Pharmacies typically print the medication instructions only on the boxes and not directly on the medication tubes.   If your medication is too expensive, please contact our office at [phone] option 4 or send [place] a message through [place].   We are unable to tell what your co-pay for medications will be in advance as this is different depending on your insurance coverage. However, we may be able to find a substitute medication at lower cost or fill out paperwork to get insurance to cover a needed medication.   If a prior authorization is required to get your medication covered by your insurance company, please allow [place] 1-2 business days to complete this process.  Drug prices often vary depending on where the prescription is filled and some pharmacies may offer cheaper prices.  The website www.goodrx.com contains coupons for medications through different pharmacies. The prices here do not account for what the cost may be with help from insurance (it may be cheaper with your insurance), but the website can give you the price if you did not use any insurance.  - You can print the associated coupon and take it with your prescription to the pharmacy.  - You may also stop by our office during regular business hours and pick up a GoodRx coupon card.  - If you need your prescription sent electronically to a different pharmacy, notify our office through [facility] or by phone at [phone] option 4.     Si Usted Necesita Algo Despus de Su Visita  Tambin puede enviarnos un mensaje a travs de Pharmacist, community. Por lo general respondemos a los mensajes de MyChart en el transcurso de 1 a 2 das hbiles.  Para renovar recetas, por favor pida a su farmacia que se ponga en contacto con nuestra oficina. Harland Dingwall de fax es [place] [phone].  Si tiene un asunto urgente cuando la clnica est cerrada y que no puede esperar hasta el siguiente da hbil, puede llamar/localizar a su doctor(a) al nmero  que aparece a continuacin.   Por favor, tenga en cuenta que aunque hacemos todo lo posible para estar disponibles para asuntos urgentes fuera del horario de [place], no estamos disponibles las 24 horas del da, los 7 das de la [place].   Si tiene un problema urgente y no puede comunicarse con nosotros, puede optar por buscar atencin mdica  en el consultorio de su doctor(a), en una clnica privada, en un centro de atencin urgente o en una sala de emergencias.  Si tiene Engineering geologist, por favor llame inmediatamente al 911 o vaya a la sala de emergencias.  Nmeros de bper  - Dr. Nehemiah Massed: [phone]  - Dra. Moye: [phone]  - Dra. Nicole Kindred: [phone]  En caso de inclemencias del [place], por favor llame a Johnsie Kindred principal al [phone] para una actualizacin sobre el [place] de cualquier retraso o cierre.  Consejos para la medicacin en dermatologa: Por favor, guarde las [employer]  que vienen los medicamentos de uso tpico para ayudarle a seguir las instrucciones sobre dnde y cmo usarlos. Las farmacias generalmente imprimen las instrucciones del medicamento slo en las cajas y no directamente en los tubos del [place].   Si su medicamento es muy caro, por favor, pngase en contacto con Zigmund Daniel llamando al [phone] y presione la opcin 4 o envenos un mensaje a travs de Pharmacist, community.   No podemos decirle cul ser su copago por los medicamentos por adelantado ya que esto es diferente dependiendo de la cobertura de su seguro. Sin embargo, es posible que podamos encontrar un medicamento sustituto a Electrical engineer un formulario para que el seguro cubra el medicamento que se considera necesario.   Si se requiere una autorizacin previa para que su compaa de seguros [place] su medicamento, por favor permtanos de 1 a 2 das hbiles para completar este proceso.  Los precios de los medicamentos varan con frecuencia dependiendo del Environmental consultant de dnde se  surte la receta y alguna farmacias pueden ofrecer precios ms baratos.  El sitio web www.goodrx.com tiene cupones para medicamentos de Airline pilot. Los precios aqu no tienen en cuenta lo que podra costar con la ayuda del seguro (puede ser ms barato con su seguro), pero el sitio web puede darle el precio si no utiliz Research scientist (physical sciences).  - Puede imprimir el cupn correspondiente y llevarlo con su receta a la farmacia.  - Tambin puede pasar por nuestra oficina durante el horario de atencin regular y Charity fundraiser una tarjeta de cupones de GoodRx.  - Si necesita que su receta se enve electrnicamente a una farmacia diferente, informe a nuestra oficina a travs de MyChart de [place] o por telfono llamando al [phone] y presione la opcin 4.

## 2023-01-25 DIAGNOSIS — Z09 Encounter for follow-up examination after completed treatment for conditions other than malignant neoplasm: Secondary | ICD-10-CM | POA: Diagnosis not present

## 2023-01-25 DIAGNOSIS — I10 Essential (primary) hypertension: Secondary | ICD-10-CM | POA: Diagnosis not present

## 2023-01-25 DIAGNOSIS — Z83719 Family history of colon polyps, unspecified: Secondary | ICD-10-CM | POA: Diagnosis not present

## 2023-01-25 DIAGNOSIS — G5603 Carpal tunnel syndrome, bilateral upper limbs: Secondary | ICD-10-CM | POA: Diagnosis not present

## 2023-01-25 DIAGNOSIS — E782 Mixed hyperlipidemia: Secondary | ICD-10-CM | POA: Diagnosis not present

## 2023-03-27 ENCOUNTER — Encounter: Payer: Self-pay | Admitting: Dermatology

## 2023-03-27 ENCOUNTER — Ambulatory Visit: Payer: Medicare PPO | Admitting: Dermatology

## 2023-03-27 VITALS — BP 135/75 | HR 56

## 2023-03-27 DIAGNOSIS — X32XXXA Exposure to sunlight, initial encounter: Secondary | ICD-10-CM | POA: Diagnosis not present

## 2023-03-27 DIAGNOSIS — L570 Actinic keratosis: Secondary | ICD-10-CM

## 2023-03-27 DIAGNOSIS — W908XXA Exposure to other nonionizing radiation, initial encounter: Secondary | ICD-10-CM

## 2023-03-27 DIAGNOSIS — L578 Other skin changes due to chronic exposure to nonionizing radiation: Secondary | ICD-10-CM

## 2023-03-27 NOTE — Patient Instructions (Addendum)
Cryotherapy Aftercare  Wash gently with soap and water everyday.   Apply Vaseline Jelly or Aquaphor daily until healed.    Recommend daily broad spectrum sunscreen SPF 30+ to sun-exposed areas, reapply every 2 hours as needed. Call for new or changing lesions.  Staying in the shade or wearing long sleeves, sun glasses (UVA+UVB protection) and wide brim hats (4-inch brim around the entire circumference of the hat) are also recommended for sun protection.    Due to recent changes in healthcare laws, you may see results of your pathology and/or laboratory studies on MyChart before the doctors have had a chance to review them. We understand that in some cases there may be results that are confusing or concerning to you. Please understand that not all results are received at the same time and often the doctors may need to interpret multiple results in order to provide you with the best plan of care or course of treatment. Therefore, we ask that you please give [place] 2 business days to thoroughly review all your results before contacting the office for clarification. Should we see a critical lab result, you will be contacted sooner.   If You Need Anything After Your Visit  If you have any questions or concerns for your doctor, please call our main line at [phone] and press option 4 to reach your doctor's medical assistant. If no one answers, please leave a voicemail as directed and we will return your call as soon as possible. Messages left after 4 pm will be answered the following business day.   You may also send [place] a message via MyChart. We typically respond to MyChart messages within 1-2 business days.  For prescription refills, please ask your pharmacy to contact our office. Our fax number is [phone].  If you have an urgent issue when the clinic is closed that cannot wait until the next business day, you can page your doctor at the number below.    Please note that while we do our best  to be available for urgent issues outside of office hours, we are not available 24/7.   If you have an urgent issue and are unable to reach [place], you may choose to seek medical care at your doctor's office, retail clinic, urgent care center, or emergency room.  If you have a medical emergency, please immediately call 911 or go to the emergency department.  Pager Numbers  - Dr. Gwen Pounds: [phone]  - Dr. Neale Burly: [phone]  - Dr. Roseanne Reno: [phone]  In the event of inclement weather, please call our main line at [phone] for an update on the status of any delays or closures.  Dermatology Medication Tips: Please keep the boxes that topical medications come in in order to help keep track of the instructions about where and how to use these. Pharmacies typically print the medication instructions only on the boxes and not directly on the medication tubes.   If your medication is too expensive, please contact our office at [phone] option 4 or send [place] a message through MyChart.   We are unable to tell what your co-pay for medications will be in advance as this is different depending on your insurance coverage. However, we may be able to find a substitute medication at lower cost or fill out paperwork to get insurance to cover a needed medication.   If a prior authorization is required to get your medication covered by your insurance company, please allow [place] 1-2 business days to complete this process.  Drug prices often vary depending on where the prescription is filled and some pharmacies may offer cheaper prices.  The website www.goodrx.com contains coupons for medications through different pharmacies. The prices here do not account for what the cost may be with help from insurance (it may be cheaper with your insurance), but the website can give you the price if you did not use any insurance.  - You can print the associated coupon and take it with your prescription to the  pharmacy.  - You may also stop by our office during regular business hours and pick up a GoodRx coupon card.  - If you need your prescription sent electronically to a different pharmacy, notify our office through [place] MyChart or by phone at [phone] option 4.     Si Usted Necesita Algo Despus de Su Visita  Tambin puede enviarnos un mensaje a travs de MyChart. Por lo general respondemos a los mensajes de MyChart en el transcurso de 1 a 2 das hbiles.  Para renovar recetas, por favor pida a su farmacia que se ponga en contacto con nuestra oficina. Nuestro nmero de fax es el [phone].  Si tiene un asunto urgente cuando la clnica est cerrada y que no puede esperar hasta el siguiente da hbil, puede llamar/localizar a su doctor(a) al nmero que aparece a continuacin.   Por favor, tenga en cuenta que aunque hacemos todo lo posible para estar disponibles para asuntos urgentes fuera del horario de oficina, no estamos disponibles las 24 horas del da, los 7 das de la semana.   Si tiene un problema urgente y no puede comunicarse con nosotros, puede optar por buscar atencin mdica  en el consultorio de su doctor(a), en una clnica privada, en un centro de atencin urgente o en una sala de emergencias.  Si tiene una emergencia mdica, por favor llame inmediatamente al 911 o vaya a la sala de emergencias.  Nmeros de bper  - Dr. Kowalski: [phone]  - Dra. Moye: [phone]  - Dra. Stewart: [phone]  En caso de inclemencias del tiempo, por favor llame a nuestra lnea principal al [phone] para una actualizacin sobre el estado de cualquier retraso o cierre.  Consejos para la medicacin en dermatologa: Por favor, guarde las cajas en las que vienen los medicamentos de uso tpico para ayudarle a seguir las instrucciones sobre dnde y cmo usarlos. Las farmacias generalmente imprimen las instrucciones del medicamento slo en las cajas y no directamente en los  tubos del medicamento.   Si su medicamento es muy caro, por favor, pngase en contacto con nuestra oficina llamando al [phone] y presione la opcin 4 o envenos un mensaje a travs de MyChart.   No podemos decirle cul ser su copago por los medicamentos por adelantado ya que esto es diferente dependiendo de la cobertura de su seguro. Sin embargo, es posible que podamos encontrar un medicamento sustituto a menor costo o llenar un formulario para que el seguro cubra el medicamento que se considera necesario.   Si se requiere una autorizacin previa para que su compaa de seguros cubra su medicamento, por favor permtanos de 1 a 2 das hbiles para completar este proceso.  Los precios de los medicamentos varan con frecuencia dependiendo del lugar de dnde se surte la receta y alguna farmacias pueden ofrecer precios ms baratos.  El sitio web www.goodrx.com tiene cupones para medicamentos de diferentes farmacias. Los precios aqu no tienen en cuenta lo que podra costar con la ayuda del seguro (puede ser ms   barato con su seguro), pero el sitio web puede darle el precio si no utiliz ningn seguro.  - Puede imprimir el cupn correspondiente y llevarlo con su receta a la farmacia.  - Tambin puede pasar por nuestra oficina durante el horario de atencin regular y recoger una tarjeta de cupones de GoodRx.  - Si necesita que su receta se enve electrnicamente a una farmacia diferente, informe a nuestra oficina a travs de MyChart de [place] o por telfono llamando al [phone] y presione la opcin 4.  

## 2023-03-27 NOTE — Progress Notes (Signed)
   Follow-Up Visit   Subjective  Becky Carr is a 72 y.o. female who presents for the following: Spot on scalp and lip. States same old places to check. Hx of Aks treated in the past.  The patient has spots, moles and lesions to be evaluated, some may be new or changing and the patient has concerns that these could be cancer.    The following portions of the chart were reviewed this encounter and updated as appropriate: medications, allergies, medical history  Review of Systems:  No other skin or systemic complaints except as noted in HPI or Assessment and Plan.  Objective  Well appearing patient in no apparent distress; mood and affect are within normal limits.  A focused examination was performed of the following areas: Scalp, face, lips  Relevant physical exam findings are noted in the Assessment and Plan.    Assessment & Plan   ACTINIC KERATOSIS Exam: Erythematous thin papules/macules with gritty scale  Actinic keratoses are precancerous spots that appear secondary to cumulative UV radiation exposure/sun exposure over time. They are chronic with expected duration over 1 year. A portion of actinic keratoses will progress to squamous cell carcinoma of the skin. It is not possible to reliably predict which spots will progress to skin cancer and so treatment is recommended to prevent development of skin cancer.  Recommend daily broad spectrum sunscreen SPF 30+ to sun-exposed areas, reapply every 2 hours as needed.  Recommend staying in the shade or wearing long sleeves, sun glasses (UVA+UVB protection) and wide brim hats (4-inch brim around the entire circumference of the hat). Call for new or changing lesions.  Treatment Plan:  Prior to procedure, discussed risks of blister formation, small wound, skin dyspigmentation, or rare scar following cryotherapy. Recommend Vaseline ointment to treated areas while healing.  Destruction Procedure Note Destruction method: cryotherapy    Informed consent: discussed and consent obtained   Lesion destroyed using liquid nitrogen: Yes   Outcome: patient tolerated procedure well with no complications   Post-procedure details: wound care instructions given   Locations: mid crown scalp x3, left lateral lower lip x1 # of Lesions Treated: 4   ACTINIC DAMAGE - chronic, secondary to cumulative UV radiation exposure/sun exposure over time - diffuse scaly erythematous macules with underlying dyspigmentation - Recommend daily broad spectrum sunscreen SPF 30+ to sun-exposed areas, reapply every 2 hours as needed.  - Recommend staying in the shade or wearing long sleeves, sun glasses (UVA+UVB protection) and wide brim hats (4-inch brim around the entire circumference of the hat). - Call for new or changing lesions.   Return if symptoms worsen or fail to improve.  I, Lawson Radar, CMA, am acting as scribe for [name], MD.   Documentation: I have reviewed the above documentation for accuracy and completeness, and I agree with the above.  [name], MD

## 2023-07-24 ENCOUNTER — Other Ambulatory Visit: Payer: Self-pay | Admitting: Gerontology

## 2023-07-24 DIAGNOSIS — Z1231 Encounter for screening mammogram for malignant neoplasm of breast: Secondary | ICD-10-CM

## 2023-08-15 ENCOUNTER — Ambulatory Visit
Admission: RE | Admit: 2023-08-15 | Discharge: 2023-08-15 | Disposition: A | Discharge status: Home / Self Care | Payer: Medicare PPO | Source: Ambulatory Visit | Attending: Gerontology | Admitting: Gerontology

## 2023-08-15 DIAGNOSIS — Z1231 Encounter for screening mammogram for malignant neoplasm of breast: Secondary | ICD-10-CM | POA: Insufficient documentation

## 2023-08-27 DIAGNOSIS — I10 Essential (primary) hypertension: Secondary | ICD-10-CM | POA: Diagnosis not present

## 2023-08-27 DIAGNOSIS — R7303 Prediabetes: Secondary | ICD-10-CM | POA: Diagnosis not present

## 2023-08-27 DIAGNOSIS — J302 Other seasonal allergic rhinitis: Secondary | ICD-10-CM | POA: Diagnosis not present

## 2023-08-27 DIAGNOSIS — M25511 Pain in right shoulder: Secondary | ICD-10-CM | POA: Diagnosis not present

## 2023-08-27 DIAGNOSIS — H903 Sensorineural hearing loss, bilateral: Secondary | ICD-10-CM | POA: Diagnosis not present

## 2023-08-27 DIAGNOSIS — Z1329 Encounter for screening for other suspected endocrine disorder: Secondary | ICD-10-CM | POA: Diagnosis not present

## 2023-08-27 DIAGNOSIS — M8589 Other specified disorders of bone density and structure, multiple sites: Secondary | ICD-10-CM | POA: Diagnosis not present

## 2023-08-27 DIAGNOSIS — Z23 Encounter for immunization: Secondary | ICD-10-CM | POA: Diagnosis not present

## 2023-08-27 DIAGNOSIS — G5603 Carpal tunnel syndrome, bilateral upper limbs: Secondary | ICD-10-CM | POA: Diagnosis not present

## 2023-08-27 DIAGNOSIS — Z0000 Encounter for general adult medical examination without abnormal findings: Secondary | ICD-10-CM | POA: Diagnosis not present

## 2023-08-27 DIAGNOSIS — E782 Mixed hyperlipidemia: Secondary | ICD-10-CM | POA: Diagnosis not present

## 2023-09-12 DIAGNOSIS — M25511 Pain in right shoulder: Secondary | ICD-10-CM | POA: Diagnosis not present

## 2023-09-12 DIAGNOSIS — M19011 Primary osteoarthritis, right shoulder: Secondary | ICD-10-CM | POA: Diagnosis not present

## 2023-09-12 DIAGNOSIS — G8929 Other chronic pain: Secondary | ICD-10-CM | POA: Diagnosis not present

## 2024-02-05 ENCOUNTER — Ambulatory Visit: Payer: Medicare PPO | Admitting: Dermatology

## 2024-02-06 ENCOUNTER — Ambulatory Visit: Admitting: Dermatology

## 2024-02-06 DIAGNOSIS — L219 Seborrheic dermatitis, unspecified: Secondary | ICD-10-CM

## 2024-02-06 DIAGNOSIS — W908XXA Exposure to other nonionizing radiation, initial encounter: Secondary | ICD-10-CM | POA: Diagnosis not present

## 2024-02-06 DIAGNOSIS — L570 Actinic keratosis: Secondary | ICD-10-CM | POA: Diagnosis not present

## 2024-02-06 DIAGNOSIS — L578 Other skin changes due to chronic exposure to nonionizing radiation: Secondary | ICD-10-CM

## 2024-02-06 NOTE — Progress Notes (Signed)
 Follow-Up Visit   Subjective  Becky Carr is a 73 y.o. female who presents for the following: Lesions in the scalp, irritated, patient would like checked today. Hx Aks in the scalp.  The patient has spots, moles and lesions to be evaluated, some may be new or changing and the patient may have concern these could be cancer.  The following portions of the chart were reviewed this encounter and updated as appropriate: medications, allergies, medical history  Review of Systems:  No other skin or systemic complaints except as noted in HPI or Assessment and Plan.  Objective  Well appearing patient in no apparent distress; mood and affect are within normal limits.   A focused examination was performed of the following areas:   Relevant exam findings are noted in the Assessment and Plan.  Crown x 2 (2) Keratotic macules. Pink scaly macules  Assessment & Plan  AK (ACTINIC KERATOSIS) (2) Crown x 2 (2) Hypertrophic -   Actinic keratoses are precancerous spots that appear secondary to cumulative UV radiation exposure/sun exposure over time. They are chronic with expected duration over 1 year. A portion of actinic keratoses will progress to squamous cell carcinoma of the skin. It is not possible to reliably predict which spots will progress to skin cancer and so treatment is recommended to prevent development of skin cancer.  Recommend daily broad spectrum sunscreen SPF 30+ to sun-exposed areas, reapply every 2 hours as needed.  Recommend staying in the shade or wearing long sleeves, sun glasses (UVA+UVB protection) and wide brim hats (4-inch brim around the entire circumference of the hat). Call for new or changing lesions. Destruction of lesion - Crown x 2 (2)  Destruction method: cryotherapy   Informed consent: discussed and consent obtained   Timeout:  patient name, date of birth, surgical site, and procedure verified Lesion destroyed using liquid nitrogen: Yes   Region frozen  until ice ball extended beyond lesion: Yes   Outcome: patient tolerated procedure well with no complications   Post-procedure details: wound care instructions given   Additional details:  Prior to procedure, discussed risks of blister formation, small wound, skin dyspigmentation, or rare scar following cryotherapy. Recommend Vaseline ointment to treated areas while healing.   ACTINIC DAMAGE - chronic, secondary to cumulative UV radiation exposure/sun exposure over time - diffuse scaly erythematous macules with underlying dyspigmentation - Recommend daily broad spectrum sunscreen SPF 30+ to sun-exposed areas, reapply every 2 hours as needed.  - Recommend staying in the shade or wearing long sleeves, sun glasses (UVA+UVB protection) and wide brim hats (4-inch brim around the entire circumference of the hat). - Call for new or changing lesions.  SEBORRHEIC DERMATITIS Exam: Pink patches with greasy scale at the scalp  Chronic and persistent condition with duration or expected duration over one year. Condition is symptomatic/ bothersome to patient. Not currently at goal.  Seborrheic Dermatitis is a chronic persistent rash characterized by pinkness and scaling most commonly of the mid face but also can occur on the scalp (dandruff), ears; mid chest, mid back and groin.  It tends to be exacerbated by stress and cooler weather.  People who have neurologic disease may experience new onset or exacerbation of existing seborrheic dermatitis.  The condition is not curable but treatable and can be controlled.  Treatment Plan: Recommend CeraVe anti-dandruff shampoo daily. Let sit a few minutes then wash out. Samples given today.  Return in about 6 months (around 08/08/2024) for Ak follow up.  Becky Carr,  CMA, am acting as scribe for Becky Niece, MD .   Documentation: I have reviewed the above documentation for accuracy and completeness, and I agree with the above.  Becky Niece, MD

## 2024-02-06 NOTE — Patient Instructions (Signed)

## 2024-06-01 DIAGNOSIS — M19011 Primary osteoarthritis, right shoulder: Secondary | ICD-10-CM | POA: Diagnosis not present

## 2024-06-09 ENCOUNTER — Ambulatory Visit: Payer: Medicare PPO | Admitting: Dermatology

## 2024-07-28 ENCOUNTER — Other Ambulatory Visit: Payer: Self-pay | Admitting: Gerontology

## 2024-07-28 DIAGNOSIS — Z1231 Encounter for screening mammogram for malignant neoplasm of breast: Secondary | ICD-10-CM

## 2024-08-11 ENCOUNTER — Ambulatory Visit: Admitting: Dermatology

## 2024-08-11 DIAGNOSIS — D229 Melanocytic nevi, unspecified: Secondary | ICD-10-CM

## 2024-08-11 DIAGNOSIS — Z1283 Encounter for screening for malignant neoplasm of skin: Secondary | ICD-10-CM | POA: Diagnosis not present

## 2024-08-11 DIAGNOSIS — L814 Other melanin hyperpigmentation: Secondary | ICD-10-CM | POA: Diagnosis not present

## 2024-08-11 DIAGNOSIS — L570 Actinic keratosis: Secondary | ICD-10-CM | POA: Diagnosis not present

## 2024-08-11 DIAGNOSIS — W57XXXA Bitten or stung by nonvenomous insect and other nonvenomous arthropods, initial encounter: Secondary | ICD-10-CM

## 2024-08-11 DIAGNOSIS — L821 Other seborrheic keratosis: Secondary | ICD-10-CM

## 2024-08-11 DIAGNOSIS — W908XXA Exposure to other nonionizing radiation, initial encounter: Secondary | ICD-10-CM

## 2024-08-11 DIAGNOSIS — L578 Other skin changes due to chronic exposure to nonionizing radiation: Secondary | ICD-10-CM | POA: Diagnosis not present

## 2024-08-11 DIAGNOSIS — D1801 Hemangioma of skin and subcutaneous tissue: Secondary | ICD-10-CM

## 2024-08-11 NOTE — Progress Notes (Signed)
 Follow-Up Visit   Subjective  Becky Carr is a 73 y.o. female who presents for the following: Skin Cancer Screening and Full Body Skin Exam  The patient presents for Total-Body Skin Exam (TBSE) for skin cancer screening and mole check. The patient has spots, moles and lesions to be evaluated, some may be new or changing. She has a spot on the right forearm to check today. History of AKs.    The following portions of the chart were reviewed this encounter and updated as appropriate: medications, allergies, medical history  Review of Systems:  No other skin or systemic complaints except as noted in HPI or Assessment and Plan.  Objective  Well appearing patient in no apparent distress; mood and affect are within normal limits.  A full examination was performed including scalp, head, eyes, ears, nose, lips, neck, chest, axillae, abdomen, back, buttocks, bilateral upper extremities, bilateral lower extremities, hands, feet, fingers, toes, fingernails, and toenails. All findings within normal limits unless otherwise noted below.   Relevant physical exam findings are noted in the Assessment and Plan.  crown x 1, L lower lip at vermilion edge x 1 (2) Pink scaly macules.  Assessment & Plan   SKIN CANCER SCREENING PERFORMED TODAY.  ACTINIC DAMAGE - Chronic condition, secondary to cumulative UV/sun exposure - diffuse scaly erythematous macules with underlying dyspigmentation - Recommend daily broad spectrum sunscreen SPF 30+ to sun-exposed areas, reapply every 2 hours as needed.  - Staying in the shade or wearing long sleeves, sun glasses (UVA+UVB protection) and wide brim hats (4-inch brim around the entire circumference of the hat) are also recommended for sun protection.  - Call for new or changing lesions.  LENTIGINES, SEBORRHEIC KERATOSES, HEMANGIOMAS - Benign normal skin lesions - Benign-appearing - Call for any changes  MELANOCYTIC NEVI - Tan-brown and/or pink-flesh-colored  symmetric macules and papules - Benign appearing on exam today - Observation - Call clinic for new or changing moles - Recommend daily use of broad spectrum spf 30+ sunscreen to sun-exposed areas.   INSECT BITE REACTION Exam: edematous pink papules chest, legs  Treatment Plan: Benign, observe.    Recommend OTC 1% hydrocortisone cream 1-2 times daily to affected area as needed for itch. Defers prescription cream.  Recommend DEET containing insect repellant (25% DEET or higher) such as [place] Off or Repel Sportsmen and wear protective clothing when outdoors.   AK (ACTINIC KERATOSIS) (2) crown x 1, L lower lip at vermilion edge x 1 (2) Actinic keratoses are precancerous spots that appear secondary to cumulative UV radiation exposure/sun exposure over time. They are chronic with expected duration over 1 year. A portion of actinic keratoses will progress to squamous cell carcinoma of the skin. It is not possible to reliably predict which spots will progress to skin cancer and so treatment is recommended to prevent development of skin cancer.  Recommend daily broad spectrum sunscreen SPF 30+ to sun-exposed areas, reapply every 2 hours as needed.  Recommend staying in the shade or wearing long sleeves, sun glasses (UVA+UVB protection) and wide brim hats (4-inch brim around the entire circumference of the hat). Call for new or changing lesions. Destruction of lesion - crown x 1, L lower lip at vermilion edge x 1 (2)  Destruction method: cryotherapy   Informed consent: discussed and consent obtained   Lesion destroyed using liquid nitrogen: Yes   Region frozen until ice ball extended beyond lesion: Yes   Outcome: patient tolerated procedure well with no complications  Post-procedure details: wound care instructions given   Additional details:  Prior to procedure, discussed risks of blister formation, small wound, skin dyspigmentation, or rare scar following cryotherapy. Recommend Vaseline  ointment to treated areas while healing.   Return in about 1 year (around 08/11/2025) for TBSE, Hx AKs.  IAndrea Kerns, CMA, am acting as scribe for Rexene Rattler, MD .   Documentation: I have reviewed the above documentation for accuracy and completeness, and I agree with the above.  Rexene Rattler, MD

## 2024-08-11 NOTE — Patient Instructions (Addendum)
 Cryotherapy Aftercare  Wash gently with soap and water  everyday.   Apply Vaseline and Band-Aid daily until healed.   Bug bites - Recommend OTC 1% hydrocortisone cream 1-2 times daily to affected area as needed for itch  Recommend DEET containing insect repellant (25% DEET or higher) such as [place] Off or Repel Sportsmen and wear protective clothing when outdoors.    Due to recent changes in healthcare laws, you may see results of your pathology and/or laboratory studies on MyChart before the doctors have had a chance to review them. We understand that in some cases there may be results that are confusing or concerning to you. Please understand that not all results are received at the same time and often the doctors may need to interpret multiple results in order to provide you with the best plan of care or course of treatment. Therefore, we ask that you please give us  2 business days to thoroughly review all your results before contacting the office for clarification. Should we see a critical lab result, you will be contacted sooner.   If You Need Anything After Your Visit  If you have any questions or concerns for your doctor, please call our main line at [phone] and press option 4 to reach your doctor's medical assistant. If no one answers, please leave a voicemail as directed and we will return your call as soon as possible. Messages left after 4 pm will be answered the following business day.   You may also send us  a message via MyChart. We typically respond to MyChart messages within 1-2 business days.  For prescription refills, please ask your pharmacy to contact our office. Our fax number is [phone].  If you have an urgent issue when the clinic is closed that cannot wait until the next business day, you can page your doctor at the number below.    Please note that while we do our best to be available for urgent issues outside of office hours, we are not available 24/7.    If you have an urgent issue and are unable to reach us , you may choose to seek medical care at your doctor's office, retail clinic, urgent care center, or emergency room.  If you have a medical emergency, please immediately call 911 or go to the emergency department.  Pager Numbers  - Dr. Hester: [phone]  - Dr. Jackquline: [phone]  - Dr. Claudene: [phone]   - Dr. Raymund: [phone]  In the event of inclement weather, please call our main line at [phone] for an update on the status of any delays or closures.  Dermatology Medication Tips: Please keep the boxes that topical medications come in in order to help keep track of the instructions about where and how to use these. Pharmacies typically print the medication instructions only on the boxes and not directly on the medication tubes.   If your medication is too expensive, please contact our office at [phone] option 4 or send us  a message through MyChart.   We are unable to tell what your co-pay for medications will be in advance as this is different depending on your insurance coverage. However, we may be able to find a substitute medication at lower cost or fill out paperwork to get insurance to cover a needed medication.   If a prior authorization is required to get your medication covered by your insurance company, please allow us  1-2 business days to complete this process.  Drug prices often vary depending on where  the prescription is filled and some pharmacies may offer cheaper prices.  The website www.goodrx.com contains coupons for medications through different pharmacies. The prices here do not account for what the cost may be with help from insurance (it may be cheaper with your insurance), but the website can give you the price if you did not use any insurance.  - You can print the associated coupon and take it with your prescription to the pharmacy.  - You may also stop by our office during regular  business hours and pick up a GoodRx coupon card.  - If you need your prescription sent electronically to a different pharmacy, notify our office through [facility] or by phone at [phone] option 4.     Si Usted Necesita Algo Despus de Su Visita  Tambin puede enviarnos un mensaje a travs de Clinical cytogeneticist. Por lo general respondemos a los mensajes de MyChart en el transcurso de 1 a 2 das hbiles.  Para renovar recetas, por favor pida a su farmacia que se ponga en contacto con nuestra oficina. Randi lakes de fax es [place] [phone].  Si tiene un asunto urgente cuando la clnica est cerrada y que no puede esperar hasta el siguiente da hbil, puede llamar/localizar a su doctor(a) al nmero que aparece a continuacin.   Por favor, tenga en cuenta que aunque hacemos todo lo posible para estar disponibles para asuntos urgentes fuera del horario de [place], no estamos disponibles las 24 horas del da, los 7 [address]  [address] de la [place].   Si tiene un problema urgente y no puede comunicarse con nosotros, puede optar por buscar atencin mdica  en el consultorio de su doctor(a), en una clnica privada, en un centro de atencin urgente o en una sala de emergencias.  Si tiene Engineer, drilling, por favor llame inmediatamente al 911 o vaya a la sala de emergencias.  Nmeros de bper  - Dr. Hester: [phone]  - Dra. Jackquline: [phone]  - Dr. Claudene: [phone]  - Dra. Kitts: [phone]  En caso de inclemencias del [place], por favor llame a nuestra lnea principal al [phone] para una actualizacin sobre el estado de cualquier retraso o cierre.  Consejos para la medicacin en dermatologa: Por favor, guarde las cajas en las que vienen los medicamentos de uso tpico para ayudarle a seguir las instrucciones sobre dnde y cmo usarlos. Las farmacias generalmente imprimen las instrucciones del medicamento slo en las cajas y no directamente en los tubos del [place].   Si su  medicamento es muy caro, por favor, pngase en contacto con landry rieger llamando al [phone] y presione la opcin 4 o envenos un mensaje a travs de Clinical cytogeneticist.   No podemos decirle cul ser su copago por los medicamentos por adelantado ya que esto es diferente dependiendo de la cobertura de su seguro. Sin embargo, es posible que podamos encontrar un medicamento sustituto a Audiological scientist un formulario para que el seguro cubra el medicamento que se considera necesario.   Si se requiere una autorizacin previa para que su compaa de seguros malta su medicamento, por favor permtanos de 1 a 2 das hbiles para completar este proceso.  Los precios de los medicamentos varan con frecuencia dependiendo del Environmental consultant de dnde se surte la receta y alguna farmacias pueden ofrecer precios ms baratos.  El sitio web www.goodrx.com tiene cupones para medicamentos de Health and safety inspector. Los precios aqu no tienen en cuenta lo que podra costar con la ayuda del seguro (puede ser ms barato con  su seguro), pero el sitio web puede darle el precio si no Visual merchandiser.  - Puede imprimir el cupn correspondiente y llevarlo con su receta a la farmacia.  - Tambin puede pasar por nuestra oficina durante el horario de atencin regular y Education officer, museum una tarjeta de cupones de GoodRx.  - Si necesita que su receta se enve electrnicamente a una farmacia diferente, informe a nuestra oficina a travs de MyChart de  o por telfono llamando al [phone] y presione la opcin 4.

## 2024-08-20 ENCOUNTER — Ambulatory Visit
Admission: RE | Admit: 2024-08-20 | Discharge: 2024-08-20 | Disposition: A | Discharge status: Home / Self Care | Source: Ambulatory Visit | Attending: Gerontology | Admitting: Gerontology

## 2024-08-20 DIAGNOSIS — Z1231 Encounter for screening mammogram for malignant neoplasm of breast: Secondary | ICD-10-CM | POA: Diagnosis not present

## 2024-08-22 DIAGNOSIS — R609 Edema, unspecified: Secondary | ICD-10-CM | POA: Diagnosis not present

## 2024-08-22 DIAGNOSIS — I10 Essential (primary) hypertension: Secondary | ICD-10-CM | POA: Diagnosis not present

## 2024-08-22 DIAGNOSIS — E7800 Pure hypercholesterolemia, unspecified: Secondary | ICD-10-CM | POA: Diagnosis not present

## 2024-08-22 DIAGNOSIS — R600 Localized edema: Secondary | ICD-10-CM | POA: Diagnosis not present

## 2024-08-31 DIAGNOSIS — M25511 Pain in right shoulder: Secondary | ICD-10-CM | POA: Diagnosis not present

## 2024-08-31 DIAGNOSIS — H903 Sensorineural hearing loss, bilateral: Secondary | ICD-10-CM | POA: Diagnosis not present

## 2024-08-31 DIAGNOSIS — E782 Mixed hyperlipidemia: Secondary | ICD-10-CM | POA: Diagnosis not present

## 2024-08-31 DIAGNOSIS — M8589 Other specified disorders of bone density and structure, multiple sites: Secondary | ICD-10-CM | POA: Diagnosis not present

## 2024-08-31 DIAGNOSIS — G5603 Carpal tunnel syndrome, bilateral upper limbs: Secondary | ICD-10-CM | POA: Diagnosis not present

## 2024-08-31 DIAGNOSIS — Z1329 Encounter for screening for other suspected endocrine disorder: Secondary | ICD-10-CM | POA: Diagnosis not present

## 2024-08-31 DIAGNOSIS — R7303 Prediabetes: Secondary | ICD-10-CM | POA: Diagnosis not present

## 2024-08-31 DIAGNOSIS — Z79899 Other long term (current) drug therapy: Secondary | ICD-10-CM | POA: Diagnosis not present

## 2024-08-31 DIAGNOSIS — J302 Other seasonal allergic rhinitis: Secondary | ICD-10-CM | POA: Diagnosis not present

## 2024-08-31 DIAGNOSIS — I10 Essential (primary) hypertension: Secondary | ICD-10-CM | POA: Diagnosis not present

## 2024-08-31 DIAGNOSIS — G8929 Other chronic pain: Secondary | ICD-10-CM | POA: Diagnosis not present

## 2024-08-31 DIAGNOSIS — Z0000 Encounter for general adult medical examination without abnormal findings: Secondary | ICD-10-CM | POA: Diagnosis not present

## 2024-08-31 DIAGNOSIS — L719 Rosacea, unspecified: Secondary | ICD-10-CM | POA: Diagnosis not present

## 2024-12-09 ENCOUNTER — Telehealth: Payer: Self-pay

## 2024-12-09 NOTE — Telephone Encounter (Signed)
 Called patient and left voicemail to return my call. aw

## 2024-12-09 NOTE — Telephone Encounter (Signed)
-----   Message from [place] S sent at 12/09/2024  1:42 PM EST ----- Regarding: Meds Pt would like to speak to someone about possibly getting a rx for hair loss. She was not sure if she needed an appt and wanted to speak to someone about this first. [phone]

## 2024-12-10 NOTE — Telephone Encounter (Signed)
 Patient called about wanting a RX for Minoxidil. She is not scheduled for a follow up with you until September. Would you like to see her for a hair loss appointment?

## 2025-01-04 ENCOUNTER — Ambulatory Visit: Admitting: Dermatology

## 2025-08-17 ENCOUNTER — Ambulatory Visit: Admitting: Dermatology
